# Patient Record
Sex: Male | Born: 1960 | Race: White | Hispanic: No | Marital: Married | Smoking: Never smoker
Health system: Southern US, Community
[De-identification: ages and names within clinical notes are randomized; demographics above are authoritative.]

## PROBLEM LIST (undated history)

## (undated) DIAGNOSIS — K589 Irritable bowel syndrome without diarrhea: Secondary | ICD-10-CM

## (undated) DIAGNOSIS — A0472 Enterocolitis due to Clostridium difficile, not specified as recurrent: Secondary | ICD-10-CM

## (undated) DIAGNOSIS — I10 Essential (primary) hypertension: Secondary | ICD-10-CM

## (undated) DIAGNOSIS — E785 Hyperlipidemia, unspecified: Secondary | ICD-10-CM

## (undated) DIAGNOSIS — K648 Other hemorrhoids: Secondary | ICD-10-CM

## (undated) DIAGNOSIS — F419 Anxiety disorder, unspecified: Secondary | ICD-10-CM

## (undated) DIAGNOSIS — M75100 Unspecified rotator cuff tear or rupture of unspecified shoulder, not specified as traumatic: Secondary | ICD-10-CM

## (undated) HISTORY — DX: Other hemorrhoids: K64.8

## (undated) HISTORY — DX: Anxiety disorder, unspecified: F41.9

## (undated) HISTORY — DX: Hyperlipidemia, unspecified: E78.5

## (undated) HISTORY — DX: Irritable bowel syndrome, unspecified: K58.9

## (undated) HISTORY — DX: Unspecified rotator cuff tear or rupture of unspecified shoulder, not specified as traumatic: M75.100

## (undated) HISTORY — DX: Enterocolitis due to Clostridium difficile, not specified as recurrent: A04.72

## (undated) HISTORY — DX: Essential (primary) hypertension: I10

## (undated) HISTORY — PX: TONSILLECTOMY: SUR1361

## (undated) HISTORY — PX: KNEE SURGERY: SHX244

---

## 2000-01-07 HISTORY — PX: TUMOR REMOVAL: SHX12

## 2000-03-03 ENCOUNTER — Encounter: Payer: Self-pay | Admitting: Family Medicine

## 2000-03-03 ENCOUNTER — Ambulatory Visit (HOSPITAL_COMMUNITY): Admission: RE | Admit: 2000-03-03 | Discharge: 2000-03-03 | Payer: Self-pay | Admitting: Family Medicine

## 2000-04-16 ENCOUNTER — Encounter (INDEPENDENT_AMBULATORY_CARE_PROVIDER_SITE_OTHER): Payer: Self-pay | Admitting: *Deleted

## 2000-04-16 ENCOUNTER — Ambulatory Visit (HOSPITAL_COMMUNITY): Admission: RE | Admit: 2000-04-16 | Discharge: 2000-04-17 | Payer: Self-pay | Admitting: Otolaryngology

## 2002-02-15 ENCOUNTER — Encounter: Payer: Self-pay | Admitting: Emergency Medicine

## 2002-02-15 ENCOUNTER — Emergency Department (HOSPITAL_COMMUNITY): Admission: EM | Admit: 2002-02-15 | Discharge: 2002-02-15 | Payer: Self-pay | Admitting: Emergency Medicine

## 2005-01-10 ENCOUNTER — Ambulatory Visit: Payer: Self-pay | Admitting: Internal Medicine

## 2005-01-27 ENCOUNTER — Ambulatory Visit: Payer: Self-pay | Admitting: Internal Medicine

## 2006-01-02 DIAGNOSIS — K648 Other hemorrhoids: Secondary | ICD-10-CM

## 2006-01-02 HISTORY — DX: Other hemorrhoids: K64.8

## 2006-02-23 ENCOUNTER — Ambulatory Visit: Payer: Self-pay | Admitting: Internal Medicine

## 2006-03-10 ENCOUNTER — Ambulatory Visit: Payer: Self-pay | Admitting: Internal Medicine

## 2006-03-10 ENCOUNTER — Encounter (INDEPENDENT_AMBULATORY_CARE_PROVIDER_SITE_OTHER): Payer: Self-pay | Admitting: *Deleted

## 2006-03-10 ENCOUNTER — Encounter (INDEPENDENT_AMBULATORY_CARE_PROVIDER_SITE_OTHER): Payer: Self-pay | Admitting: Specialist

## 2007-08-09 ENCOUNTER — Telehealth: Payer: Self-pay | Admitting: Internal Medicine

## 2007-11-29 ENCOUNTER — Encounter: Admission: RE | Admit: 2007-11-29 | Discharge: 2007-11-29 | Payer: Self-pay | Admitting: Internal Medicine

## 2008-02-04 ENCOUNTER — Telehealth: Payer: Self-pay | Admitting: Internal Medicine

## 2008-02-24 DIAGNOSIS — E785 Hyperlipidemia, unspecified: Secondary | ICD-10-CM

## 2008-02-24 DIAGNOSIS — E119 Type 2 diabetes mellitus without complications: Secondary | ICD-10-CM

## 2008-02-24 DIAGNOSIS — I10 Essential (primary) hypertension: Secondary | ICD-10-CM | POA: Insufficient documentation

## 2008-02-28 ENCOUNTER — Ambulatory Visit: Payer: Self-pay | Admitting: Internal Medicine

## 2008-02-28 DIAGNOSIS — R1084 Generalized abdominal pain: Secondary | ICD-10-CM | POA: Insufficient documentation

## 2008-03-06 ENCOUNTER — Ambulatory Visit (HOSPITAL_COMMUNITY): Admission: RE | Admit: 2008-03-06 | Discharge: 2008-03-06 | Payer: Self-pay | Admitting: Internal Medicine

## 2008-03-07 ENCOUNTER — Ambulatory Visit: Payer: Self-pay | Admitting: Internal Medicine

## 2008-03-07 DIAGNOSIS — R1031 Right lower quadrant pain: Secondary | ICD-10-CM

## 2008-03-08 ENCOUNTER — Ambulatory Visit: Payer: Self-pay | Admitting: Internal Medicine

## 2008-03-09 ENCOUNTER — Telehealth (INDEPENDENT_AMBULATORY_CARE_PROVIDER_SITE_OTHER): Payer: Self-pay | Admitting: *Deleted

## 2009-09-24 ENCOUNTER — Emergency Department (HOSPITAL_COMMUNITY): Admission: EM | Admit: 2009-09-24 | Discharge: 2009-09-24 | Payer: Self-pay | Admitting: Emergency Medicine

## 2009-10-06 HISTORY — PX: NM MYOCAR PERF WALL MOTION: HXRAD629

## 2010-02-03 LAB — CONVERTED CEMR LAB
BUN: 12 mg/dL (ref 6–23)
Creatinine, Ser: 0.9 mg/dL (ref 0.4–1.5)

## 2010-03-21 LAB — POCT I-STAT, CHEM 8
Calcium, Ion: 1.1 mmol/L — ABNORMAL LOW (ref 1.12–1.32)
Creatinine, Ser: 0.7 mg/dL (ref 0.4–1.5)
Glucose, Bld: 112 mg/dL — ABNORMAL HIGH (ref 70–99)
HCT: 47 % (ref 39.0–52.0)
Hemoglobin: 16 g/dL (ref 13.0–17.0)
Potassium: 4.4 mEq/L (ref 3.5–5.1)

## 2010-03-21 LAB — CBC
Hemoglobin: 15.4 g/dL (ref 13.0–17.0)
MCH: 30.9 pg (ref 26.0–34.0)
MCHC: 34.9 g/dL (ref 30.0–36.0)
RDW: 14.5 % (ref 11.5–15.5)

## 2010-03-21 LAB — DIFFERENTIAL
Basophils Absolute: 0 10*3/uL (ref 0.0–0.1)
Basophils Relative: 1 % (ref 0–1)
Eosinophils Absolute: 0.1 10*3/uL (ref 0.0–0.7)
Monocytes Absolute: 0.8 10*3/uL (ref 0.1–1.0)
Monocytes Relative: 11 % (ref 3–12)
Neutro Abs: 4.2 10*3/uL (ref 1.7–7.7)
Neutrophils Relative %: 64 % (ref 43–77)

## 2010-03-21 LAB — POCT CARDIAC MARKERS
CKMB, poc: <1 ng/mL — ABNORMAL LOW (ref 1.0–8.0)
Troponin i, poc: <0.05 ng/mL (ref 0.00–0.09)

## 2010-05-07 ENCOUNTER — Telehealth: Payer: Self-pay | Admitting: Internal Medicine

## 2010-05-07 NOTE — Telephone Encounter (Signed)
Pt states he thinks he is having a colitis flare. Pt has been having diarrhea, abdominal discomfort below his belly button for about 10 days. Pt would like to be seen. Appt made for pt to see Amy Esterwood PA 05/08/10@9 :30am.

## 2010-05-08 ENCOUNTER — Encounter: Payer: Self-pay | Admitting: Physician Assistant

## 2010-05-08 ENCOUNTER — Ambulatory Visit (INDEPENDENT_AMBULATORY_CARE_PROVIDER_SITE_OTHER): Payer: 59 | Admitting: Physician Assistant

## 2010-05-08 VITALS — BP 142/88 | HR 52 | Ht 70.0 in | Wt 204.2 lb

## 2010-05-08 DIAGNOSIS — R197 Diarrhea, unspecified: Secondary | ICD-10-CM

## 2010-05-08 DIAGNOSIS — K589 Irritable bowel syndrome without diarrhea: Secondary | ICD-10-CM

## 2010-05-08 DIAGNOSIS — R1084 Generalized abdominal pain: Secondary | ICD-10-CM

## 2010-05-08 MED ORDER — ALIGN 4 MG PO CAPS: 4.0000 mg | ORAL_CAPSULE | Freq: Every day | ORAL | Status: DC

## 2010-05-08 MED ORDER — HYOSCYAMINE SULFATE ER 0.375 MG PO TB12: 375.0000 ug | ORAL_TABLET | Freq: Two times a day (BID) | ORAL | Status: DC | PRN

## 2010-05-08 NOTE — Progress Notes (Signed)
  Subjective:    Patient ID: Bradley [name], male    DOB: 1960-01-14, 50 y.o.   MRN: [id number]  HPI Bradley [name] is a pleasant 50 year old white male known to Dr. Marina Goodell who has been evaluated in the past for abdominal pain and felt likely to have IBS. He underwent colonoscopy in 2008 for abdominal pain and change in his bowel habits and this was a normal exam. Random biopsies were also done and these were normal. He has had prior abdominal ultrasound which was unremarkable as well. He does have cough diabetes controlled with oral agents. He does have a history of C. difficile associated diarrhea in 2007.  He comes in today with recurrence of abdominal pain over the past 10 days. He says he is having frequent bowel movements with 5-6 bowel movements per day associated with urgency and loose stools but no overt diarrhea. He has not noted any melena or hematochezia. He complains of 80 generalized abdominal discomfort with a fullness and crampy sensation in his abdomen. His appetite has been fine he has not had any nausea or vomiting, no fever or chills. He generally does not feel any different after meals. He is concerned because of the persistence of his symptoms now over one week. He says he does take fairly frequent antibiotics for sinusitis and is sure that he has had antibiotics earlier this year. He also travels a lot  Within the [place] He had called Dr. Marja Kays office with these current symptoms and was given an empiric course of Bactrim which she has taken for the past 2 days. He has not had stool cultures or labs done.   Review of Systems  Constitutional: Negative.   HENT: Negative.   Eyes: Negative.   Respiratory: Negative.   Cardiovascular: Negative.   Gastrointestinal: Positive for abdominal pain, diarrhea and abdominal distention.  Genitourinary: Negative.   Musculoskeletal: Negative.   Skin: Negative.   Neurological: Negative.   Hematological: Negative.   Psychiatric/Behavioral:  Negative.        Objective:   Physical Exam Well-developed white male in no acute distress, pleasant, alert and oriented x3 HEENT nonicteric normocephalic EOMI PERRLA sclera anicteric  Neck; supple no JVD  Cardiovascular; regular rate and rhythm with S1 and S2 no murmur rub or gallop  Pulmonary; clear bilaterally  Abdomen; soft no appreciable distention bowel sounds are active, he is mildly tender across his mid abdomen and in the left mid quadrant left lower quadrant, there is no guarding or rebound no palpable mass or hepatosplenomegaly  Rectal; not done  Skin; warm and dry benign  Psych; mood and affect normal an appropriate        Assessment & Plan:  #52 50 year old white male with remote history of C. difficile colitis, and history of IBS with 10 day history of generalized mid abdominal pain bloating cramping and frequent stools. Rule out exacerbation of IBS, rule out infectious gastroenteritis, rule out recurrent C. difficile.  Plan; check stool cultures including C. difficile apcr, O&P, wbc's and routine culture. Will continue current course of Bactrim for now Start trial of Align one by mouth daily x30 days Cap refill Levbid one by mouth twice a day as needed for cramping Further plans pending results of stool cultures and his clinical course, he is asked to call in about 10 days if he is not significantly improved.  #2 Adult onset diabetes mellitus  #3 Hypertension  #4 Hyperlipidemia

## 2010-05-08 NOTE — Progress Notes (Signed)
I agree with this plan.

## 2010-05-08 NOTE — Patient Instructions (Signed)
Please go to the basement level to have your labs drawn.  We have given you samples of Align to take for 4 weeks, take 1 capsule daily. Continue the Bactrim you are taking. We sent a prescription for Levbid ( Hyoscyamine) to take 1 every 12 hours as needed for cramping and spasms. We sent it to [facility].  Call us in 7-10 days or sooner if you are not doing better.

## 2010-05-09 ENCOUNTER — Other Ambulatory Visit: Payer: 59

## 2010-05-09 DIAGNOSIS — K589 Irritable bowel syndrome without diarrhea: Secondary | ICD-10-CM

## 2010-05-09 DIAGNOSIS — R1084 Generalized abdominal pain: Secondary | ICD-10-CM

## 2010-05-09 DIAGNOSIS — R197 Diarrhea, unspecified: Secondary | ICD-10-CM

## 2010-05-10 LAB — CLOSTRIDIUM DIFFICILE BY PCR

## 2010-05-10 LAB — FECAL LACTOFERRIN, QUANT: Lactoferrin: NEGATIVE

## 2010-05-13 LAB — STOOL CULTURE

## 2010-05-21 ENCOUNTER — Telehealth: Payer: Self-pay | Admitting: Internal Medicine

## 2010-05-21 NOTE — Telephone Encounter (Signed)
Pt is calling and asking for his lab results from his visit on May 3rd. Please advise.

## 2010-05-22 NOTE — Telephone Encounter (Signed)
SORRY, I THOUGHT WE CALLED THEM TOO HIM- ALL OF THE STOOL STUDIES  WERE NEGATIVE. HOW IS HE FEELING?

## 2010-05-22 NOTE — Telephone Encounter (Signed)
Results given to pt per Mike Gip PA. Pt states he is feeling about the same and is following up with his primary care md.

## 2010-05-24 NOTE — Op Note (Signed)
[place]. [facility]  Patient:    Bradley [name], Bradley [name]                   MRN: [id number] Proc. Date: 04/16/00 Attending:  Keturah Barre, M.D. CC:         Doris Cheadle. Lyman Bishop, M.D.  Marinda Elk, M.D.   Operative Report  PREOPERATIVE DIAGNOSIS:  Right second branchial cleft cyst.  POSTOPERATIVE DIAGNOSIS:  Right second branchial cleft cyst.  OPERATION PERFORMED:  Resection of right second branchial cleft cyst.  SURGEON:  Keturah Barre, M.D.  ASSISTANT:  Doris Cheadle. Lyman Bishop, M.D.  ANESTHESIA:  General endotracheal.  ANESTHESIOLOGIST:  Dr. Ivin Booty.  DESCRIPTION OF PROCEDURE:  The patient was placed in supine position.  Under general endotracheal anesthesia, the patient was positioned, shoulder roll and the head turned to the left.  The right neck exposed and the neck extended. There was an obvious raised full area of 3 x 3 cm mass beneath the sternocleidomastoid.  He was prepped and draped in the appropriate manner using Betadine x 3 from the clavicles to the eyes and once appropriately draped, we used a marking pen to mark landmarks and then using an incisional crease in the midneck, an incision was made, carried down through the platysma.  The sternocleidomastoid was developed where we developed superiorly and inferiorly, the flaps of skin and then the sternocleidomastoid was mobilized.  Beneath this was the large cyst.  The cyst was quite well encapsulated.  Was not subject to a huge amount of inflammation.  Some inflammation was noted, making it rather tight and rather difficult to elevate but we were able to elevate most of this cyst before we removed the fluid from the cyst.  Once the fluid was removed, we did a culture, suctioned out the entire cyst, cultured anaerobic and aerobic.  The patient having been on Biaxin primarily and cephalosporins and is on IV cephalosporin at this time. The neck was carefully irrigated and then dissection was  carried on where the medial one quarter of the dissection was finished.  We carefully dissected this cyst off the carotid vessels and carefully guarded the vagus and the jugular vein and once this was separated, the cyst was quite easy to remove. It did not extend medial to the carotid system.  Once the cyst was removed, then we carefully Bovie electrocoagulated any small bleeder.  We Bovie electrocoagulated bleeders as we removed the cyst so it was really quite dry and the carotid jugular system and the vagus region was in excellent condition.  All hemostasis was established on the back side of the sternocleidomastoid and the skin flaps were checked again and found to be clear of any bleeding.  Irrigation was then carried out once again and then a #10 Jackson-Pratt drain was placed through a separate small incision in the posterior triangle and extended into this area.  A small amount of Surgicel was placed over the carotid region also as an extra safety precaution.  Once all hemostasis was established, the closure was begun by replacing our dissection freeing up the sternocleidomastoid and then the subcutaneous closure was with 3-0 plain catgut.  Ties were used during the surgery using 2-0 silk and then the skin was closed with 5-0 Ethilon.  No drainage of note was noted in the Jackson-Pratt bulb as we were closing.  The patient was doing very well postoperatively and will stay overnight for observation.  His follow-up will be then after observation in  one week, three weeks, six weeks, and six months. DD:  04/16/00 TD:  04/16/00 Job: [id number]

## 2010-05-24 NOTE — Assessment & Plan Note (Signed)
[facility]                         GASTROENTEROLOGY OFFICE NOTE   NAME:Bradley [name], Bradley [name]                    MRN:          [id number]  DATE:02/23/2006                            DOB:          05/25/1960    HISTORY OF PRESENT ILLNESS:  Bradley [name] presents today with new complaints  of abdominal discomfort and change of bowel habits. He is a 50 year old  with a history of Clostridium difficile colitis, hypertension,  hyperlipidemia, and diabetes. He was last evaluated January 27, 2005  when he had recovered from antibiotic associated diarrhea. He presents  now with a several month history of intermittent problems with  generalized cramping abdominal discomfort generally made worse with  meals and occasionally associated with urgency and loose stools.  Symptoms do seem to be improved with defecation. He has also noticed  some blood in mucus intermittently. He has had insignificant weight  loss. No nausea, vomiting, upper abdominal pain, heartburn, dysphagia or  melena. He has been taking Pepto-Bismol with slightly improved form of  his stools. He denies fevers, joint aches, rash or recent exposure to  antibiotics.   CURRENT MEDICATIONS:  1. Lipitor 10 mg daily.  2. Diovan 12.5 mg daily.  3. Lorazepam 0.5 mg t.i.d.  4. Nadolol 5 mg daily.  5. Actos unspecified dosage daily.   ALLERGIES:  He has no known drug allergies.   PHYSICAL EXAMINATION:  GENERAL:  A well-appearing male in no acute  distress.  VITAL SIGNS:  Blood pressure 142/78, heart rate 60 and regular, weight  188.6 pounds.  HEENT:  Sclerae anicteric, conjunctivae are pink. Oral mucosa intact. No  aphthous ulcerations. NECK:  There is no adenopathy.  LUNGS:  Clear.  HEART:  Regular.  ABDOMEN:  Soft without tenderness, masses or hernia. Good bowel sounds  heard.  RECTAL:  Deferred.  EXTREMITIES:  Without edema.   IMPRESSION:  Several month history of problems with abdominal cramping,  urgency, loose stools. Also some blood in mucus. No nocturnal symptoms.  Suspect irritable bowel though cannot rule out idiopathic colitis.   RECOMMENDATIONS:  1. Colonoscopy with biopsies. The nature of the procedure as well as      the risks, benefits, and alternatives have been reviewed. He      understands and agrees to proceed.  2. If colonoscopy and biopsies are negative for macroscopic or      microscopic colitis, then treatment for probable irritable bowel      including probiotics and antispasmodics.     Wilhemina Bonito. Marina Goodell, MD  Electronically Signed    JNP/MedQ  DD: 02/23/2006  DT: 02/23/2006  Job #: [id number]   cc:   Barry Dienes. Eloise Harman, M.D.

## 2010-05-24 NOTE — H&P (Signed)
[place]. [facility]  Patient:    Bradley [name], Bradley [name]                      MRN: [id number] Adm. Date:  04/16/00 Attending:  Keturah Barre, M.D. CC:         Marinda Elk, M.D., [facility]   History and Physical  CHIEF COMPLAINT: This patient is a 50 year old male who has had what started with a swelling in his right neck, which increased in size and which became painful.  HISTORY OF PRESENT ILLNESS: He was placed on antibiotics and a CAT scan was obtained, and it showed a 3 x 3.8 cm cystic lesion which appeared to be a branchial cleft cyst.  He has been on Keflex and Biaxin and Ceftin in an effort to bring this back under control, and the inflammation has been brought under control and the pain is much better but the size of the cyst has pretty much stayed the same.  A CAT scan shows that it is right up against and pushing the carotid branches medial, and it is essentially directly under the sternocleidomastoid muscle.  After a fairly long term here, approximately six to seven weeks of medical care, the patient now enters for resection of this right second branchial cleft cyst.  CURRENT MEDICATIONS:  1. Zestoretic 20/12.5 mg q.a.m.  2. Nadolol 20 mg q.a.m.  3. Lipitor 10 mg q.h.s.  SOCIAL HISTORY: He does not smoke.  PAST SURGICAL HISTORY: He had a T&A as a child.  PAST MEDICAL HISTORY: He has been on medication for four years for hypertension and his blood pressure is 120/70 at this point.  The remainder of his general medical health is excellent.  No respiratory problems.  No neurologic, endocrine, GI, GU, or other ENT problems.  PHYSICAL EXAMINATION:  VITAL SIGNS: His blood pressure is 120/70, pulse is 60, respiratory rate is 12.  He is 70 inches tall and weighs 200 pounds.  GENERAL: He is a well-developed, well-nourished white male in no acute distress.  HEENT: Ears are clear.  The tympanic membranes look excellent.  The  larynx is clear.  NECK: Obvious right raised firm cystic mass noted beneath the sternocleidomastoid muscle.  The vocal cords, true cords, false cords, epiglottic, and base of tongue are all clear of any ulceration or mass.  The tonsillar region is all clear of ulceration or mass.  Floor of mouth and salivary glands within normal limits.  CHEST: Clear.  No rales, rhonchi, or wheezes.  CARDIOVASCULAR: No murmurs, rubs, or gallops.  ABDOMEN: Unremarkable.  EXTREMITIES: Unremarkable.  NEUROLOGIC: Cranial nerves 2-12 within normal limits and symmetrical. Oriented x 3.  Cranial nerves intact.  DIAGNOSES:  1. Right second branchial cleft cyst.  2. History of blood pressure elevation.  3. History of tonsillectomy and adenoidectomy as a child. DD:  04/16/00 TD:  04/16/00 Job: [id number]

## 2010-10-25 ENCOUNTER — Emergency Department (HOSPITAL_COMMUNITY): Payer: 59

## 2010-10-25 ENCOUNTER — Emergency Department (HOSPITAL_COMMUNITY)
Admission: EM | Admit: 2010-10-25 | Discharge: 2010-10-25 | Disposition: A | Discharge status: Home / Self Care | Payer: 59 | Attending: Emergency Medicine | Admitting: Emergency Medicine

## 2010-10-25 DIAGNOSIS — E7800 Pure hypercholesterolemia, unspecified: Secondary | ICD-10-CM | POA: Insufficient documentation

## 2010-10-25 DIAGNOSIS — I10 Essential (primary) hypertension: Secondary | ICD-10-CM | POA: Insufficient documentation

## 2010-10-25 DIAGNOSIS — E119 Type 2 diabetes mellitus without complications: Secondary | ICD-10-CM | POA: Insufficient documentation

## 2010-10-25 DIAGNOSIS — Z79899 Other long term (current) drug therapy: Secondary | ICD-10-CM | POA: Insufficient documentation

## 2010-10-25 DIAGNOSIS — G8918 Other acute postprocedural pain: Secondary | ICD-10-CM | POA: Insufficient documentation

## 2010-10-25 LAB — BASIC METABOLIC PANEL
BUN: 13 mg/dL (ref 6–23)
CO2: 27 mEq/L (ref 19–32)
Calcium: 11.1 mg/dL — ABNORMAL HIGH (ref 8.4–10.5)
Creatinine, Ser: 0.88 mg/dL (ref 0.50–1.35)
Glucose, Bld: 174 mg/dL — ABNORMAL HIGH (ref 70–99)

## 2010-10-25 LAB — CBC
HCT: 45.9 % (ref 39.0–52.0)
Hemoglobin: 16.3 g/dL (ref 13.0–17.0)
MCH: 30.6 pg (ref 26.0–34.0)
MCHC: 35.5 g/dL (ref 30.0–36.0)
MCV: 86.1 fL (ref 78.0–100.0)
RBC: 5.33 MIL/uL (ref 4.22–5.81)

## 2010-10-25 LAB — DIFFERENTIAL
Lymphs Abs: 1.1 10*3/uL (ref 0.7–4.0)
Monocytes Absolute: 1.8 10*3/uL — ABNORMAL HIGH (ref 0.1–1.0)
Monocytes Relative: 10 % (ref 3–12)
Neutro Abs: 14.8 10*3/uL — ABNORMAL HIGH (ref 1.7–7.7)
Neutrophils Relative %: 84 % — ABNORMAL HIGH (ref 43–77)

## 2011-10-28 ENCOUNTER — Telehealth: Payer: Self-pay | Admitting: Internal Medicine

## 2011-10-28 NOTE — Telephone Encounter (Signed)
Pt states he has had diarrhea and abdominal pain that radiates around to his back, the pain is around the waist line. His PCP gave him levbid and it helped a little bit but is not helping now. Requesting to be seen. Pt scheduled to see Willette Cluster NP Thursday at 8:30am. Pt aware of appt date and time.

## 2011-10-30 ENCOUNTER — Other Ambulatory Visit: Payer: 59

## 2011-10-30 ENCOUNTER — Encounter: Payer: Self-pay | Admitting: *Deleted

## 2011-10-30 ENCOUNTER — Ambulatory Visit (INDEPENDENT_AMBULATORY_CARE_PROVIDER_SITE_OTHER): Payer: 59 | Admitting: Nurse Practitioner

## 2011-10-30 VITALS — BP 140/78 | HR 58 | Ht 69.0 in | Wt 197.0 lb

## 2011-10-30 DIAGNOSIS — R197 Diarrhea, unspecified: Secondary | ICD-10-CM | POA: Insufficient documentation

## 2011-10-30 MED ORDER — METRONIDAZOLE 250 MG PO TABS: 250.0000 mg | ORAL_TABLET | Freq: Three times a day (TID) | ORAL | Status: AC

## 2011-10-30 MED ORDER — ALIGN PO CAPS: 1.0000 | ORAL_CAPSULE | Freq: Every day | ORAL | Status: DC

## 2011-10-30 NOTE — Patient Instructions (Addendum)
Please go to the basement level to  the lab for stool studies.  Take the Hyoscyamine (Levbid) as needed for cramps. Bradley [name] ACNP will have a nurse call you with stool study results.  We will call your pharmacy with the Flagyl prescription and keep that on hold .  If you think you need it they can fill it.  We have given you samples of Align. This puts good bacteria back into your colon. You should take 1 capsule by mouth once daily. If this works well for you, it can be purchased over the counter.

## 2011-10-30 NOTE — Progress Notes (Signed)
10/30/2011 Bradley [name] [id number] Jun 03, 1960   History of Present Illness:  Patient is a 51 year old male with a history of diabetes, hypertensionknown to Dr.Perry for a history of irritable bowel type symptoms as well as a remote history of C. difficile colitis in 2007. Patient was last seen May 2012 for evaluation of abdominal pain and loose stool.  At that time his stool studies were negative. Patient was given a trial of probiotics and hyoscyamine.  His symptoms resolved and he did well until late September after returning from a trip to [place]. 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 Upon arriving home from [place] the patient developed crampy diarrhea. A few days later he saw PCP. I reviewed records, his CBC was normal. Patient was advised to try probiotics for 21 days. A week or so ago patient called his PCP and was given Hyoscyamine for cramps. He felt better for a week but now with recurrent lower abdominal discomfort. Patient is not having diarrhea  . but his he is having more frequent BMs than normal(averging about 4 bowel movements a day).  No recent antibiotics  No fevers. No blood in stool. He is taking Hyoscyamine once daily.  Current Medications, Allergies, Past Medical History, Past Surgical History, Family History and Social History were reviewed in [employer] record.   Physical Exam: General: Well developed , white male in no acute distress Head: Normocephalic and atraumatic Eyes:  sclerae anicteric, conjunctiva pink  Ears: Normal auditory acuity Lungs: Clear throughout to auscultation Heart: Regular rate and rhythm Abdomen: Soft, non distended, mild BLQ tenderness. No masses, no hepatomegaly. Normal bowel sounds Musculoskeletal: Symmetrical with no gross deformities  Extremities: No edema  Neurological: Alert oriented x 4, grossly nonfocal Psychological:  Alert and cooperative. Normal mood and  affect  Assessment and Recommendations:  51 year old male with recent history of crampy diarrhea.  Diarrhea resolved with probiotics and hyoscyamine but still having increased frequency of formed stools and he is still having lower abdominal discomfort.  Patient does have a history of C. difficile colitis in 2007. Doubt recurrent C. difficile colitis but it does need to be excluded so stool studies for C. Diff, as well as stool culture will be obtained.  CBC at PCPs office was normal, abdominal exam is not overly concerning today. I will give patient a prescription for Flagyl to begin over the weekend should his symptoms worsen. Patient will be called with stool study results. He may increase Hyoscyamine to 2-3 times daily as needed.

## 2011-10-31 ENCOUNTER — Encounter: Payer: Self-pay | Admitting: Nurse Practitioner

## 2011-10-31 NOTE — Progress Notes (Signed)
Agree with initial assessment and plans. Gunnar Fusi will followup on stool studies. Hopefully antispasmodic will help

## 2011-11-03 ENCOUNTER — Telehealth: Payer: Self-pay | Admitting: Internal Medicine

## 2011-11-03 LAB — STOOL CULTURE

## 2011-11-03 MED ORDER — CILIDINIUM-CHLORDIAZEPOXIDE 2.5-5 MG PO CAPS: ORAL_CAPSULE | ORAL | Status: DC

## 2011-11-03 NOTE — Telephone Encounter (Signed)
Pt did not take the Flagyl. Instructed pt to pick up the Flagyl and start taking the medication. Librax prescription sent to pharmacy. Pt to call back and scheduled F/U OV.

## 2011-11-03 NOTE — Telephone Encounter (Signed)
Pt last seen by Willette Cluster NP 10/30/11. Pts stool studies were negative for cdiff. Pt is taking align and levbid. States the levbid is not really helping with his cramping. His diarrhea has resolved but he is still having about 3-4 stools/day.States he is still having dull pain around his waist that goes all the way down through his groin. Pt wants to know what he can do about the pain around his waist line area. Dr. Marina Goodell please advise.

## 2011-11-03 NOTE — Telephone Encounter (Signed)
Paula prescribed metronidazole at the time of his last visit. Did he fill the prescription and complete it? If not, and have him do such. Also, prescribed Librax #100. One by mouth every 6 hours when necessary cramping pain. No refills. Have him followup with me in 6 weeks.

## 2011-11-10 ENCOUNTER — Other Ambulatory Visit: Payer: 59

## 2011-11-10 ENCOUNTER — Encounter: Payer: Self-pay | Admitting: Physician Assistant

## 2011-11-10 ENCOUNTER — Telehealth: Payer: Self-pay | Admitting: Internal Medicine

## 2011-11-10 ENCOUNTER — Ambulatory Visit (INDEPENDENT_AMBULATORY_CARE_PROVIDER_SITE_OTHER)
Admission: RE | Admit: 2011-11-10 | Discharge: 2011-11-10 | Disposition: A | Discharge status: Home / Self Care | Payer: 59 | Source: Ambulatory Visit | Attending: Physician Assistant | Admitting: Physician Assistant

## 2011-11-10 ENCOUNTER — Ambulatory Visit (INDEPENDENT_AMBULATORY_CARE_PROVIDER_SITE_OTHER): Payer: 59 | Admitting: Physician Assistant

## 2011-11-10 ENCOUNTER — Other Ambulatory Visit (INDEPENDENT_AMBULATORY_CARE_PROVIDER_SITE_OTHER): Payer: 59

## 2011-11-10 VITALS — BP 148/84 | HR 64 | Ht 70.0 in | Wt 193.8 lb

## 2011-11-10 DIAGNOSIS — R109 Unspecified abdominal pain: Secondary | ICD-10-CM

## 2011-11-10 LAB — CBC WITH DIFFERENTIAL/PLATELET
Basophils Relative: 0.5 % (ref 0.0–3.0)
Eosinophils Relative: 0.7 % (ref 0.0–5.0)
HCT: 48.3 % (ref 39.0–52.0)
Hemoglobin: 15.9 g/dL (ref 13.0–17.0)
MCV: 87.8 fl (ref 78.0–100.0)
Monocytes Absolute: 0.7 10*3/uL (ref 0.1–1.0)
Neutro Abs: 4.8 10*3/uL (ref 1.4–7.7)
Neutrophils Relative %: 66.8 % (ref 43.0–77.0)
RBC: 5.51 Mil/uL (ref 4.22–5.81)
WBC: 7.1 10*3/uL (ref 4.5–10.5)

## 2011-11-10 LAB — BASIC METABOLIC PANEL
BUN: 13 mg/dL (ref 6–23)
GFR: 86.45 mL/min (ref >60.00)
Glucose, Bld: 121 mg/dL — ABNORMAL HIGH (ref 70–99)
Potassium: 4.9 mEq/L (ref 3.5–5.1)

## 2011-11-10 MED ORDER — IOHEXOL 300 MG/ML  SOLN
100.0000 mL | Freq: Once | INTRAMUSCULAR | Status: AC | PRN
  Administered 2011-11-10: 100 mL via INTRAVENOUS

## 2011-11-10 NOTE — Telephone Encounter (Signed)
Pt states he is not better, he is still having abdominal pain. States the pain is mainly on his left side and it radiates down in the groin area. States that this pain comes and goes and at times it is a 6 on a scale of 1-10. Pt has levbid and took flagyl but states the pain is not better. Pt scheduled to see Mike Gip PA today at 11am. Pt aware of appt date and time.

## 2011-11-10 NOTE — Progress Notes (Signed)
Subjective:    Patient ID: Bradley [name], male    DOB: [birth year], 51 y.o.   MRN: [id number]  HPI Bradley [name] is a pleasant 51 year old white male known to Dr. Marina Goodell , with history of adult onset diabetes mellitus, hypertension, and hyperlipidemia. He has had intermittent problems with lower Donald discomfort and increased frequency of stools and carries a diagnosis of IBS. He had colonoscopy done in 2008 which was negative, including random biopsies. He did have an episode of C. difficile colitis in 2007. He was seen here by Willette Cluster in October of 2013, at that time complaining of diarrhea and abdominal discomfort which had started after a business trip. He had stool studies done, all of which were negative, his hyoscyamine dose was increased, and he was given an empiric short course of Flagyl. He says initially that he thought the Flagyl was making him feel better but now over the past 6 days he said increased lower abdominal discomfort particularly on the left side which is dull low level, but fairly constant  during the day. He says he has not been by been bothered by abdominal discomfort at night. His bowel movements have been fairly normal for him, somewhat on the loose side- no blood noted. He has not had any fever chills or sweats. He says his appetite has been decreased, he has not been particularly hungry, but has not lost any weight.    Review of Systems  Constitutional: Negative.   HENT: Negative.   Eyes: Negative.   Respiratory: Negative.   Cardiovascular: Negative.   Gastrointestinal: Positive for abdominal pain and diarrhea.  Genitourinary: Negative.   Musculoskeletal: Negative.   Neurological: Negative.   Hematological: Negative.   Psychiatric/Behavioral: Negative.    Outpatient Prescriptions Prior to Visit  Medication Sig Dispense Refill  . atorvastatin (LIPITOR) 40 MG tablet Take 40 mg by mouth daily.        . bifidobacterium infantis (ALIGN) capsule Take 1 capsule by  mouth daily.  21 capsule  0  . clidinium-chlordiazePOXIDE (LIBRAX) 2.5-5 MG per capsule Take 1 by mouth every 6 hours as needed for abdominal cramping  100 capsule  0  . nadolol (CORGARD) 20 MG tablet Take by mouth. Take 1/2 tablet once daily       . pioglitazone (ACTOS) 15 MG tablet Take 15 mg by mouth daily.        . Probiotic Product (ALIGN) 4 MG CAPS Take 4 mg by mouth daily.  28 capsule  0  . valsartan-hydrochlorothiazide (DIOVAN HCT) 80-12.5 MG per tablet Take 1 tablet by mouth daily.        . hyoscyamine (LEVBID) 0.375 MG 12 hr tablet Take 1 tablet (375 mcg total) by mouth every 12 (twelve) hours as needed for cramping.  60 tablet  1  . metroNIDAZOLE (FLAGYL) 250 MG tablet Take 1 tablet (250 mg total) by mouth 3 (three) times daily.  30 tablet  0      No Known Allergies Patient Active Problem List  Diagnosis  . DM  . HYPERLIPIDEMIA  . HYPERTENSION  . ABDOMINAL PAIN-GENERALIZED  . IBS (irritable bowel syndrome)  . Diarrhea   History  Substance Use Topics  . Smoking status: Never Smoker   . Smokeless tobacco: Current User    Types: Chew  . Alcohol Use: Yes    Objective:   Physical Exam  well-developed white male in no acute distress, pleasant blood pressure 140/84 pulse 64 height 5 foot 10 weight 193. HEENT; nontraumatic normocephalic  EOMI PERRLA sclera anicteric,Neck; Supple no JVD, Cardiovascular; regular rate and rhythm with S1-S2 no murmur or gallop, Pulmonary; clear,, Abdomen; soft bowel sounds are active, no palpable mass or hepatosplenomegaly he is tender in the left mid quadrant and left lower quadrant no guarding or rebound, Rectal; exam not done, Extremities; no clubbing, cyanosis, or edema skin warm and dry, Psych; somewhat anxious but otherwise appropriate.        Assessment & Plan:  #77  51 year old male with history of IBS, last colonoscopy 2008 negative now presents with fairly constant dull left-sided lower abdominal  discomfort over the past 2 weeks, not  responding to a short course of Flagyl and hyoscyamine. Will rule out and transabdominal inflammatory process, diverticulitis.  #2 adult onset diabetes mellitus  #3 hypertension  Plan; schedule for CT scan of the abdomen and pelvis with contrast Check CBC with differential and be met today Further plans pending results of CT

## 2011-11-10 NOTE — Patient Instructions (Addendum)
You have been scheduled for a CT scan of the abdomen and pelvis at [facility] ([address]---this is in the same building as [facility]).   You are scheduled on 11-10-2011 at 3:30pm. You should arrive 15 minutes prior to your appointment time for registration. Please follow the written instructions below on the day of your exam:  WARNING: IF YOU ARE ALLERGIC TO IODINE/X-RAY DYE, PLEASE NOTIFY RADIOLOGY IMMEDIATELY AT [phone]! YOU WILL BE GIVEN A 13 HOUR PREMEDICATION PREP.  1) Do not eat or drink anything NOW except contrast 2) You have been given 2 bottles of oral contrast to drink. The solution may taste better if refrigerated, but do NOT add ice or any other liquid to this solution. Shake  well before drinking.    Drink 1 bottle of contrast @ 1:30 pm (2 hours prior to your exam)  Drink 1 bottle of contrast @ 2:30 pm (1 hour prior to your exam)  You may take any medications as prescribed with a small amount of water except for the following: Metformin, Glucophage, Glucovance, Avandamet, Riomet, Fortamet, Actoplus Met, Janumet, Glumetza or Metaglip. The above medications must be held the day of the exam AND 48 hours after the exam.  The purpose of you drinking the oral contrast is to aid in the visualization of your intestinal tract. The contrast solution may cause some diarrhea. Before your exam is started, you will be given a small amount of fluid to drink. Depending on your individual set of symptoms, you may also receive an intravenous injection of x-ray contrast/dye. Plan on being at [facility] for 30 minutes or long, depending on the type of exam you are having performed.  This test typically takes 30-45 minutes to complete.  If you have any questions regarding your exam or if you need to reschedule, you may call the CT department at [phone] between the hours of 8:00 am and 5:00 pm,  Monday-Friday.  ________________________________________________________________________    Please go to the basement for lab work before leaving today

## 2011-11-10 NOTE — Progress Notes (Signed)
Reviewed and agree with management plan. Shanterica Biehler T. Medea Deines MD FACG 

## 2011-11-11 ENCOUNTER — Telehealth: Payer: Self-pay | Admitting: Physician Assistant

## 2011-11-11 ENCOUNTER — Encounter: Payer: Self-pay | Admitting: Gastroenterology

## 2011-11-11 NOTE — Telephone Encounter (Signed)
ERROR

## 2011-11-19 NOTE — Telephone Encounter (Signed)
Nurse called pt with results.

## 2012-02-28 ENCOUNTER — Telehealth: Payer: Self-pay | Admitting: Gastroenterology

## 2012-02-28 MED ORDER — METRONIDAZOLE 500 MG PO TABS: 500.0000 mg | ORAL_TABLET | Freq: Three times a day (TID) | ORAL | Status: DC

## 2012-02-28 NOTE — Telephone Encounter (Signed)
On call phone note.  Bradley [name] has had acute, non bloody diarrhea for past 48 hours.  No fevers or chills.  abx for sinus infection in past 1-2 months by PCP.  Has had c diff in past.  I'm calling him in 10 day course of flagyl 500 tid for presumptive c. Diff.  He is already shceduled for rov with Dr. Marina Goodell in about 1-2 weeks, knows to call sooner if he worsnes before them.

## 2012-03-02 ENCOUNTER — Telehealth: Payer: Self-pay | Admitting: Internal Medicine

## 2012-03-02 NOTE — Telephone Encounter (Signed)
Pt states that he is feeling better now. Pt wanted to know if there is something he could take daily to help with IBS. Discussed with pt that he has align on his med list but he states he is not taking it now. Suggested pt take the align daily and to discuss this with Dr. Marina Goodell at his visit next week. Pt verbalized understanding.

## 2012-03-02 NOTE — Telephone Encounter (Signed)
Patient called in Saturday and spoke to doctor on call.  Dr. Christella Hartigan stated in note "On call phone note. Bradley [name] has had acute, non bloody diarrhea for past 48 hours. No fevers or chills. abx for sinus infection in past 1-2 months by PCP. Has had c diff in past. I'm calling him in 10 day course of flagyl 500 tid for presumptive c. Diff. He is already shceduled for rov with Dr. Marina Goodell in about 1-2 weeks, knows to call sooner if he worsnes before them."  Patient says he still has diarrhea and over all "not feeling well".  Has been on antibiotic since Saturday.

## 2012-03-08 ENCOUNTER — Encounter: Payer: Self-pay | Admitting: *Deleted

## 2012-03-08 ENCOUNTER — Telehealth: Payer: Self-pay | Admitting: Internal Medicine

## 2012-03-08 NOTE — Telephone Encounter (Signed)
Pt was given antibiotics for possible cdiff not diverticulitis flare. See phone note from Dr. Christella Hartigan.

## 2012-03-08 NOTE — Telephone Encounter (Signed)
Left message for pt to call back.  Pt was scheduled to see Dr. Marina Goodell for f/u of diverticulitis. Pt requesting to still be seen. Pt scheduled to see Chauncey Mann PA tomorrow at 10am. Pt aware of appt date and time.

## 2012-03-09 ENCOUNTER — Ambulatory Visit: Payer: 59 | Admitting: Internal Medicine

## 2012-03-09 ENCOUNTER — Encounter: Payer: Self-pay | Admitting: Gastroenterology

## 2012-03-09 ENCOUNTER — Ambulatory Visit (INDEPENDENT_AMBULATORY_CARE_PROVIDER_SITE_OTHER): Payer: 59 | Admitting: Gastroenterology

## 2012-03-09 VITALS — BP 124/70 | HR 56 | Ht 70.0 in | Wt 197.0 lb

## 2012-03-09 DIAGNOSIS — K589 Irritable bowel syndrome without diarrhea: Secondary | ICD-10-CM

## 2012-03-09 MED ORDER — HYOSCYAMINE SULFATE ER 0.375 MG PO TB12: 375.0000 ug | ORAL_TABLET | Freq: Two times a day (BID) | ORAL | Status: DC | PRN

## 2012-03-09 NOTE — Progress Notes (Signed)
Agree 

## 2012-03-09 NOTE — Progress Notes (Signed)
03/09/2012 Bradley [name] [id number] 01/15/1960   History of Present Illness:  Bradley [name] is a pleasant 52 year old white male known to Dr. Marina Goodell , with history of adult onset diabetes mellitus, hypertension, and hyperlipidemia. He has had intermittent problems with lower abdominal discomfort and increased frequency of stools and carries a diagnosis of IBS. He had colonoscopy done in 2008 which was negative, including random biopsies. He did have an episode of C. difficile colitis in 2007.  He was seen here by Willette Cluster in October of 2013, at that time complaining of diarrhea and abdominal discomfort which had started after a business trip. He had stool studies done, all of which were negative, his hyoscyamine dose was increased, and he was given an empiric short course of Flagyl. He says initially that he thought the Flagyl was making him feel better but then after 6 days he said increased lower abdominal discomfort particularly on the left side which was dull low level, but fairly constant  during the day.  He saw Amy Esterwood at that time and a CT scan of the abdomen and pelvis was ordered.  That study was normal.  He call on 2/22 with complaints of increased diarrhea.  Dr. Christella Hartigan suggested an empiric course of flagyl for possible Cdiff infection since he had this in the past.  He just finished the flagyl a couple of days ago and says that he has been feeling better.  Stools are loose with no blood.  No abdominal pain but just intermittent cramping.  Is here today for follow-up.  Current Medications, Allergies, Past Medical History, Past Surgical History, Family History and Social History were reviewed in [employer] record.   Physical Exam: BP 124/70  Pulse 56  Ht 5\' 10"  (1.778 m)  Wt 197 lb (89.359 kg)  BMI 28.27 kg/m2  SpO2 96% General: Well developed, white male in no acute distress Head: Normocephalic and atraumatic Eyes:  sclerae anicteric, conjunctiva pink   Ears: Normal auditory acuity Lungs: Clear throughout to auscultation Heart: Regular rate and rhythm Abdomen: Soft, non-tender and non-distended. No masses, no hepatomegaly. Normal bowel sounds. Musculoskeletal: Symmetrical with no gross deformities  Extremities: No edema  Neurological: Alert oriented x 4, grossly nonfocal Psychological:  Alert and cooperative. Normal mood and affect  Assessment and Recommendations: -IBS with diarrhea predominance.  Feeling fine currently.  S/P recent course of flagyl.  Normal colonoscopy 2008 and normal CT abdomen and pelvis 11/2011.  *He will begin re-taking align probiotics daily.  I also gave him another prescription for levbid to take prn.  Follow-up with Dr. Marina Goodell in 2-3 months.

## 2012-03-09 NOTE — Patient Instructions (Signed)
Please follow up with Dr. Marina Goodell in two or three months.  Please continue your Align. Take one capsule by mouth once daily.  You received a prescription today for Levbid. Please take as directed.

## 2012-03-18 ENCOUNTER — Telehealth: Payer: Self-pay | Admitting: Internal Medicine

## 2012-03-18 MED ORDER — DICYCLOMINE HCL 10 MG PO CAPS: ORAL_CAPSULE | ORAL | Status: DC

## 2012-03-18 NOTE — Telephone Encounter (Signed)
Bradley [name] pt with IBS, saw [name] PA 03/09/12. He was placed on Align daily and Levbid as needed. Pt is some better but still reports he is having about 4-5 diarrhea stools/day and he has some abdominal crampy pain. Pt is going out of town Saturday and wants to know if there is something else he can take. Would Bentyl perhaps be an option for the pt? Dr. Russella Dar as doc of the day please advise.

## 2012-03-18 NOTE — Telephone Encounter (Signed)
Pt states he is taking the Levbid BID and still having problems. Let him know we will send in a script for the Bentyl and he can try taking it as recommended.

## 2012-03-18 NOTE — Telephone Encounter (Signed)
He can try taking Levbid bid for the next several days and then can change to prn If he is currently using Levbid bid regularly and is still having problems try Bentyl 10 mg po qid for the next several days and then can change to prn

## 2012-03-22 ENCOUNTER — Telehealth: Payer: Self-pay | Admitting: Internal Medicine

## 2012-03-22 NOTE — Telephone Encounter (Signed)
Pt called and wanted to know if the IBS symptoms can come and go, explained to pt that yes they can. States he had no discomfort at all Saturday but Sunday and today he has had some discomfort. Pt is taking the bentyl, encouraged him to continue with the bentyl and to call us if his symptoms continue or get worse.

## 2012-04-02 ENCOUNTER — Telehealth: Payer: Self-pay | Admitting: Gastroenterology

## 2012-04-02 NOTE — Telephone Encounter (Signed)
He can use Imodium prn.  Has history of IBS.  Thank you,  Jess

## 2012-04-02 NOTE — Telephone Encounter (Signed)
Patient is out of town. He is calling to report diarrhea today. States he has had 10-11 diarrhea stools. Denies cramping, bleeding, nausea, vomiting or fever. States he has taken Bentyl and Kaopectate. He reports he has been eating "rich foods" and maybe that caused diarrhea. He is asking if there is anything else he should take. Hx IBS and hx c.diff. Please, advise.

## 2012-04-02 NOTE — Telephone Encounter (Signed)
Spoke with patient and gave him [name], [place] recommendations.

## 2012-04-07 ENCOUNTER — Telehealth: Payer: Self-pay | Admitting: Internal Medicine

## 2012-04-07 NOTE — Telephone Encounter (Signed)
Pt states that he is having problems with his IBS. Pt has Levbid and Bentyl to take but states it is not helping. Pt states that he is usually good in the am but by the afternoon he has abdominal pain below his belly button. Pt scheduled to see Dr. Marina Goodell tomorrow at 10am. Pt aware of appt date and time.

## 2012-04-08 ENCOUNTER — Other Ambulatory Visit (INDEPENDENT_AMBULATORY_CARE_PROVIDER_SITE_OTHER): Payer: 59

## 2012-04-08 ENCOUNTER — Ambulatory Visit (INDEPENDENT_AMBULATORY_CARE_PROVIDER_SITE_OTHER): Payer: 59 | Admitting: Internal Medicine

## 2012-04-08 ENCOUNTER — Encounter: Payer: Self-pay | Admitting: Internal Medicine

## 2012-04-08 VITALS — BP 116/80 | HR 60 | Ht 68.75 in | Wt 192.5 lb

## 2012-04-08 DIAGNOSIS — K589 Irritable bowel syndrome without diarrhea: Secondary | ICD-10-CM

## 2012-04-08 DIAGNOSIS — R197 Diarrhea, unspecified: Secondary | ICD-10-CM

## 2012-04-08 DIAGNOSIS — R1084 Generalized abdominal pain: Secondary | ICD-10-CM

## 2012-04-08 LAB — IGA: IgA: 293 mg/dL (ref 68–378)

## 2012-04-08 MED ORDER — RIFAXIMIN 550 MG PO TABS: 550.0000 mg | ORAL_TABLET | Freq: Three times a day (TID) | ORAL | Status: DC

## 2012-04-08 NOTE — Patient Instructions (Addendum)
You have been given a separate informational sheet regarding your tobacco use, the importance of quitting and local resources to help you quit.  Your physician has requested that you go to the basement for lab work before leaving today  We have sent the following medications to your pharmacy for you to pick up at your convenience:  Dickie La  Please follow up with Dr. Marina Goodell in 3 months

## 2012-04-08 NOTE — Progress Notes (Signed)
HISTORY OF PRESENT ILLNESS:  Bradley [name] is a 52 y.o. male with a history of diabetes, hypertension, hyperlipidemia, and diarrhea predominant irritable bowel syndrome. His history dates back to early 2007 when he was diagnosed with Clostridium difficile associated diarrhea. He improved after treatment. Thereafter he developed problems with postprandial abdominal discomfort urgency and loose stools. He has had varying degrees of difficulty with this since. He did have complete colonoscopy in 2008 including random biopsies. In October of 2013 he had multiple stool studies which were negative. He has had questionable response to Flagyl at times. No improvement in pain with antispasmodics. He did have a CT scan of the abdomen and pelvis 11/10/2011. This was normal. Comes in today with ongoing complaints. She tells me that he normally has 2-3 bowel movements per day. Occasionally more. Bowels tend to be loose. Several morning and several after lunch. No nocturnal symptoms. No weight loss. No bleeding. No fevers. He is very anxious that there may be something sinister causing his symptoms. He repeat this multiple times. I have reviewed his workup with him. Symptoms seem to be exacerbated by stress and certain meals. He does better with activity  REVIEW OF SYSTEMS:  All non-GI ROS negative except for anxiety  Past Medical History  Diagnosis Date  . Hyperlipidemia   . Diabetes mellitus without mention of complication   . Hypertension   . Internal hemorrhoids without mention of complication 01/02/2006  . IBS (irritable bowel syndrome)   . C. difficile colitis     Past Surgical History  Procedure Laterality Date  . Tonsillectomy    . Tumor removal  2002    from neck  . Knee surgery Right     Social History Bradley [name]  reports that he has never smoked. His smokeless tobacco use includes Chew. He reports that  drinks alcohol. He reports that he does not use illicit drugs.  family  history includes Diabetes in his father and Heart disease in his mother.  There is no history of Colon cancer.  No Known Allergies     PHYSICAL EXAMINATION: Vital signs: BP 116/80  Pulse 60  Ht 5' 8.75" (1.746 m)  Wt 192 lb 8 oz (87.317 kg)  BMI 28.64 kg/m2 General: Well-developed, well-nourished, no acute distress HEENT: Sclerae are anicteric, conjunctiva pink. Oral mucosa intact Lungs: Clear Heart: Regular Abdomen: soft, nontender, nondistended, no obvious ascites, no peritoneal signs, normal bowel sounds. No organomegaly. Extremities: No edema Psychiatric: alert and oriented x3. Cooperative   ASSESSMENT:  #1. 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 Anxiety   PLAN:  #1. Celiac sprue panel and serum IgA #2. Xifaxan 550 mg by mouth 3 times a day x2 weeks. May use on demand if helpful #3. LONG discussion of IBS. Additionally, literature provided and he was directed toward a educational website. #4. Routine GI followup in 3 months.

## 2012-04-09 LAB — GLIA (IGA/G) + TTG IGA: Tissue Transglutaminase Ab, IgA: 4.6 U/mL (ref <20)

## 2012-04-15 ENCOUNTER — Telehealth: Payer: Self-pay | Admitting: Internal Medicine

## 2012-04-15 MED ORDER — ONDANSETRON HCL 4 MG PO TABS: 4.0000 mg | ORAL_TABLET | Freq: Two times a day (BID) | ORAL | Status: DC | PRN

## 2012-04-15 NOTE — Telephone Encounter (Signed)
Bradley [name] pt last OV 04/08/12, pt has IBS. Pt given Xifaxan 550mg  TID for 2 weeks. Pt states that he is having nausea in the mornings, has tried OTC nausea medications but they have not helped. Pt is requesting something for nausea. Dr. Jarold Motto as doc of the day please advise.

## 2012-04-15 NOTE — Telephone Encounter (Signed)
zofran 4mg  q12h prn is ok

## 2012-04-15 NOTE — Telephone Encounter (Signed)
Pt aware and script sent to the pharmacy. 

## 2012-04-28 ENCOUNTER — Telehealth: Payer: Self-pay | Admitting: Internal Medicine

## 2012-04-28 NOTE — Telephone Encounter (Signed)
Pt states he is still having issues with abdominal pain and a lot of bloating. Pt scheduled to see Dr. Marina Goodell Friday 04/30/12@10am . Pt aware of appt date and time.

## 2012-04-30 ENCOUNTER — Ambulatory Visit (INDEPENDENT_AMBULATORY_CARE_PROVIDER_SITE_OTHER): Payer: 59 | Admitting: Internal Medicine

## 2012-04-30 ENCOUNTER — Encounter: Payer: Self-pay | Admitting: Internal Medicine

## 2012-04-30 VITALS — BP 120/80 | HR 51 | Ht 68.6 in | Wt 192.4 lb

## 2012-04-30 DIAGNOSIS — R1084 Generalized abdominal pain: Secondary | ICD-10-CM

## 2012-04-30 DIAGNOSIS — K589 Irritable bowel syndrome without diarrhea: Secondary | ICD-10-CM

## 2012-04-30 DIAGNOSIS — F4323 Adjustment disorder with mixed anxiety and depressed mood: Secondary | ICD-10-CM

## 2012-04-30 MED ORDER — SERTRALINE HCL 50 MG PO TABS: 50.0000 mg | ORAL_TABLET | Freq: Every evening | ORAL | Status: DC | PRN

## 2012-04-30 NOTE — Patient Instructions (Addendum)
We have sent the following medications to your pharmacy for you to pick up at your convenience:  Zoloft  

## 2012-04-30 NOTE — Progress Notes (Signed)
HISTORY OF PRESENT ILLNESS:  Bradley [name] is a 52 y.o. male with diabetes mellitus, hypertension, hyperlipidemia, and diarrhea predominant irritable bowel syndrome, felt to be postinfectious and dating back to 2007. He was last seen in the office 04/09/2012. His principal problem was abdominal pain and altered bowel habits secondary to IBS. As well, significant health related anxiety. I spent greater than 30 minutes counseling the patient as well as providing and literature on the topics and referral to educational websites. Screening for celiac sprue was negative. He was treated empirically with Xifaxan x2 weeks and asked to followup in 3 months. However, he contacted the office to schedule this appointment. He continues to have the same symptoms. No response to Xifaxan or probiotic Align. He still has great concerns that conditions other than IBS responsible for his symptoms. He continues to reiterate that his discomfort stop prevent him from his usual activities. His weight remains stable. No alarm features. Problem seems to be most noticeable at night no does not prevent him from sleeping or interrupts sleep. Negative colonoscopy with biopsies March 2008 and negative CT scan of the abdomen and pelvis November 2013, and unremarkable extensive blood work including celiac testing.  REVIEW OF SYSTEMS:  All non-GI ROS negative except for anxiety  Past Medical History  Diagnosis Date  . Hyperlipidemia   . Diabetes mellitus without mention of complication   . Hypertension   . Internal hemorrhoids without mention of complication 01/02/2006  . IBS (irritable bowel syndrome)   . C. difficile colitis     Past Surgical History  Procedure Laterality Date  . Tonsillectomy    . Tumor removal  2002    from neck  . Knee surgery Right     Social History [name]  reports that he has never smoked. His smokeless tobacco use includes Chew. He reports that  drinks alcohol. He reports that he  does not use illicit drugs.  family history includes Diabetes in his father and Heart disease in his mother.  There is no history of Colon cancer.  No Known Allergies     PHYSICAL EXAMINATION: Vital signs: BP 120/80  Pulse 51  Ht 5' 8.6" (1.742 m)  Wt 192 lb 6 oz (87.261 kg)  BMI 28.76 kg/m2 General: Well-developed, well-nourished, no acute distress HEENT: Sclerae are anicteric, conjunctiva pink. Oral mucosa intact Lungs: Clear Heart: Regular Abdomen: soft, nontender, nondistended, no obvious ascites, no peritoneal signs, normal bowel sounds. No organomegaly. Extremities: No edema Psychiatric: alert and oriented x3. Cooperative   ASSESSMENT:  #1. Diarrhea predominant IBS, postinfectious dating back to 2007. Ongoing #2. Anxiety, health related and otherwise. Significant factor   PLAN:  #1. Long discussion again today on the known pathophysiology of IBS. As well, limitations with regards to treatment #2. Offered, and encouraged, the opportunity to be seen at a tertiary care center for second opinion. He declines #3. Discussed other options. To that end, we will initiate Zoloft 50 mg at night. I offered to have him come see me in the office in 4 weeks. However he prefers to contact my nurse regarding his progress. At that time, he can decide if his therapy needs to be adjusted. He noticed contact the office in the interim for any questions or problems.

## 2012-05-04 ENCOUNTER — Encounter (HOSPITAL_COMMUNITY): Payer: Self-pay | Admitting: Emergency Medicine

## 2012-05-04 ENCOUNTER — Emergency Department (HOSPITAL_COMMUNITY)
Admission: EM | Admit: 2012-05-04 | Discharge: 2012-05-04 | Disposition: A | Discharge status: Home / Self Care | Payer: 59 | Attending: Emergency Medicine | Admitting: Emergency Medicine

## 2012-05-04 DIAGNOSIS — Z8719 Personal history of other diseases of the digestive system: Secondary | ICD-10-CM | POA: Insufficient documentation

## 2012-05-04 DIAGNOSIS — Z8679 Personal history of other diseases of the circulatory system: Secondary | ICD-10-CM | POA: Insufficient documentation

## 2012-05-04 DIAGNOSIS — I10 Essential (primary) hypertension: Secondary | ICD-10-CM | POA: Insufficient documentation

## 2012-05-04 DIAGNOSIS — Z79899 Other long term (current) drug therapy: Secondary | ICD-10-CM | POA: Insufficient documentation

## 2012-05-04 DIAGNOSIS — R0789 Other chest pain: Secondary | ICD-10-CM

## 2012-05-04 DIAGNOSIS — R079 Chest pain, unspecified: Secondary | ICD-10-CM | POA: Insufficient documentation

## 2012-05-04 DIAGNOSIS — E785 Hyperlipidemia, unspecified: Secondary | ICD-10-CM | POA: Insufficient documentation

## 2012-05-04 DIAGNOSIS — E119 Type 2 diabetes mellitus without complications: Secondary | ICD-10-CM | POA: Insufficient documentation

## 2012-05-04 LAB — COMPREHENSIVE METABOLIC PANEL
AST: 20 U/L (ref 0–37)
Albumin: 4.5 g/dL (ref 3.5–5.2)
Alkaline Phosphatase: 88 U/L (ref 39–117)
Chloride: 96 mEq/L (ref 96–112)
Potassium: 3.6 mEq/L (ref 3.5–5.1)
Sodium: 133 mEq/L — ABNORMAL LOW (ref 135–145)
Total Bilirubin: 0.8 mg/dL (ref 0.3–1.2)
Total Protein: 7.8 g/dL (ref 6.0–8.3)

## 2012-05-04 LAB — CBC WITH DIFFERENTIAL/PLATELET
Basophils Absolute: 0.1 10*3/uL (ref 0.0–0.1)
Basophils Relative: 1 % (ref 0–1)
Eosinophils Absolute: 0 10*3/uL (ref 0.0–0.7)
Hemoglobin: 15.4 g/dL (ref 13.0–17.0)
MCHC: 35.8 g/dL (ref 30.0–36.0)
Neutro Abs: 5.1 10*3/uL (ref 1.7–7.7)
Neutrophils Relative %: 71 % (ref 43–77)
Platelets: 290 10*3/uL (ref 150–400)
RDW: 13.1 % (ref 11.5–15.5)

## 2012-05-04 LAB — URINALYSIS, MICROSCOPIC ONLY
Bilirubin Urine: NEGATIVE
Glucose, UA: NEGATIVE mg/dL
Ketones, ur: NEGATIVE mg/dL
Leukocytes, UA: NEGATIVE
Specific Gravity, Urine: 1.031 — ABNORMAL HIGH (ref 1.005–1.030)
pH: 5.5 (ref 5.0–8.0)

## 2012-05-04 LAB — POCT I-STAT TROPONIN I: Troponin i, poc: 0 ng/mL (ref 0.00–0.08)

## 2012-05-04 NOTE — ED Notes (Addendum)
Pt presenting to ed with c/o burning in his chest pt states he is seeing gastroenterologist for his IBS but he's still having burning sensation. Pt states he's still having pain and diarrhea. Pt denies nausea and vomiting at this time. Pt states he also feels very anxious and nervous and this could be anxiety

## 2012-05-04 NOTE — ED Provider Notes (Signed)
History     CSN: [id number]  Arrival date & time 05/04/12  0721   First MD Initiated Contact with Patient 05/04/12 [phone]      Chief Complaint  Patient presents with  . Abdominal Pain    (Consider location/radiation/quality/duration/timing/severity/associated sxs/prior treatment) HPI Comments: 52 y.o. Male with PMHx of IBS (2007), chronic abdominal issues, DM, HTN, HLD presents with concern over chest pain he felt about 10pm last night. While he was here, pt wanted to "pick my brain" about is chronic abdominal pain. Pt is seen frequently by PCP for his abdominal issues.   Pt denies pain or distress at this time. Pt denies shortness of breath, diaphoresis, radiation of pain to jaw, back, arms, nausea/vomiting, headache, dizziness, numbness. No heart hx although pt does see a cardiologist, "just to check."    Note from PCP from 4 days ago:  CC abdominal pain and altered bowel habits secondary to IBS,  significant health related anxiety. I spent greater than 30 minutes counseling the patient as well as providing and literature on the topics and referral to educational websites. Screening for celiac sprue was negative. He was treated empirically with Xifaxan x2 weeks and asked to followup in 3 months. However, he contacted the office to schedule this appointment. He continues to have the same symptoms. No response to Xifaxan or probiotic Align. He still has great concerns that conditions other than IBS responsible for his symptoms. He continues to reiterate that his discomfort stop prevent him from his usual activities. His weight remains stable. No alarm features. Problem seems to be most noticeable at night no does not prevent him from sleeping or interrupts sleep. Negative colonoscopy with biopsies March 2008 and negative CT scan of the abdomen and pelvis November 2013, and unremarkable extensive blood work including celiac testing.   Patient is a 52 y.o. male presenting with abdominal pain.   Abdominal Pain Associated symptoms: no chest pain, no constipation, no diarrhea, no dysuria, no fever, no nausea, no shortness of breath and no vomiting     Past Medical History  Diagnosis Date  . Hyperlipidemia   . Diabetes mellitus without mention of complication   . Hypertension   . Internal hemorrhoids without mention of complication 01/02/2006  . IBS (irritable bowel syndrome)   . C. difficile colitis     Past Surgical History  Procedure Laterality Date  . Tonsillectomy    . Tumor removal  2002    from neck  . Knee surgery Right     Family History  Problem Relation Age of Onset  . Heart disease Mother   . Diabetes Father   . Colon cancer Neg Hx     History  Substance Use Topics  . Smoking status: Never Smoker   . Smokeless tobacco: Current User    Types: Chew  . Alcohol Use: Yes      Review of Systems  Constitutional: Negative for fever and diaphoresis.  HENT: Negative for neck pain and neck stiffness.   Eyes: Negative for visual disturbance.  Respiratory: Negative for apnea, chest tightness and shortness of breath.   Cardiovascular: Negative for chest pain and palpitations.  Gastrointestinal: Positive for abdominal pain. Negative for nausea, vomiting, diarrhea and constipation.  Genitourinary: Negative for dysuria.  Musculoskeletal: Negative for gait problem.  Skin: Negative for rash.  Neurological: Negative for dizziness, weakness, light-headedness, numbness and headaches.    Allergies  Review of patient's allergies indicates no known allergies.  Home Medications   Current Outpatient Rx  Name  Route  Sig  Dispense  Refill  . ALPRAZolam (XANAX) 0.5 MG tablet   Oral   Take 0.5 mg by mouth as needed for sleep.         Marland Kitchen atorvastatin (LIPITOR) 40 MG tablet   Oral   Take 40 mg by mouth daily.           . nadolol (CORGARD) 20 MG tablet   Oral   Take by mouth 2 (two) times daily. Take 1/2 tablet once daily         . ondansetron (ZOFRAN) 4  MG tablet   Oral   Take 1 tablet (4 mg total) by mouth every 12 (twelve) hours as needed for nausea.   30 tablet   1   . pioglitazone (ACTOS) 15 MG tablet   Oral   Take 15 mg by mouth daily.           . sertraline (ZOLOFT) 50 MG tablet   Oral   Take 1 tablet (50 mg total) by mouth at bedtime as needed.   30 tablet   3   . valsartan-hydrochlorothiazide (DIOVAN-HCT) 160-12.5 MG per tablet   Oral   Take 1 tablet by mouth daily.           BP 166/97  Pulse 60  Temp(Src) 98.4 F (36.9 C) (Oral)  Resp 22  SpO2 98%  Physical Exam  Nursing note and vitals reviewed. Constitutional: He is oriented to person, place, and time. He appears well-developed and well-nourished. No distress.  HENT:  Head: Normocephalic and atraumatic.  Eyes: Conjunctivae and EOM are normal.  Neck: Normal range of motion. Neck supple.  No meningeal signs  Cardiovascular: Normal rate, regular rhythm and normal heart sounds.  Exam reveals no gallop and no friction rub.   No murmur heard. Pulmonary/Chest: Effort normal and breath sounds normal. No respiratory distress. He has no wheezes. He has no rales. He exhibits no tenderness.  Abdominal: Soft. Bowel sounds are normal. He exhibits no distension. There is no tenderness. There is no rebound and no guarding.  Musculoskeletal: Normal range of motion. He exhibits no edema and no tenderness.  Neurological: He is alert and oriented to person, place, and time. No cranial nerve deficit.  Skin: Skin is warm and dry. He is not diaphoretic. No erythema.  Psychiatric:  anxious    ED Course  Procedures (including critical care time)  Labs Reviewed  COMPREHENSIVE METABOLIC PANEL - Abnormal; Notable for the following:    Sodium 133 (*)    Glucose, Bld 158 (*)    All other components within normal limits  URINALYSIS, MICROSCOPIC ONLY - Abnormal; Notable for the following:    Color, Urine AMBER (*)    APPearance CLOUDY (*)    Specific Gravity, Urine 1.031  (*)    All other components within normal limits  CBC WITH DIFFERENTIAL  LIPASE, BLOOD  POCT I-STAT TROPONIN I   No results found.  Date: 05/04/2012  Rate: 47  Rhythm: sinus brady  QRS Axis: normal  Intervals: normal  ST/T Wave abnormalities: non specific T wave changes  Conduction Disutrbances: none  Narrative Interpretation: abnormal EKG  Old EKG Reviewed: None available   1. Non-cardiac chest pain       MDM  Patient is to be discharged with recommendation to follow up with PCP in regards to today's hospital visit. Chest pain is not likely of cardiac or pulmonary etiology d/t presentation, perc negative, VSS, no tracheal deviation, no JVD  or new murmur, RRR, breath sounds equal bilaterally, EKG without acute abnormalities, negative troponin, and negative CXR. Pt has been advised to return to the ED if CP becomes exertional, associated with diaphoresis or nausea, radiates to left jaw/arm, worsens or becomes concerning in any way. Pt appears reliable for follow up and is agreeable to discharge.   Glade Nurse, PA-C 05/04/12 1641

## 2012-05-04 NOTE — ED Provider Notes (Signed)
History/physical exam/procedure(s) were performed by non-physician practitioner and as supervising physician I was immediately available for consultation/collaboration. I have reviewed all notes and am in agreement with care and plan.   Kenshin Splawn S Quin Mathenia, MD 05/04/12 1642 

## 2012-08-29 ENCOUNTER — Encounter (HOSPITAL_COMMUNITY): Payer: Self-pay | Admitting: Emergency Medicine

## 2012-08-29 ENCOUNTER — Emergency Department (HOSPITAL_COMMUNITY)
Admission: EM | Admit: 2012-08-29 | Discharge: 2012-08-29 | Disposition: A | Discharge status: Home / Self Care | Payer: 59 | Attending: Emergency Medicine | Admitting: Emergency Medicine

## 2012-08-29 DIAGNOSIS — R0789 Other chest pain: Secondary | ICD-10-CM | POA: Insufficient documentation

## 2012-08-29 DIAGNOSIS — K589 Irritable bowel syndrome without diarrhea: Secondary | ICD-10-CM | POA: Insufficient documentation

## 2012-08-29 DIAGNOSIS — Z8619 Personal history of other infectious and parasitic diseases: Secondary | ICD-10-CM | POA: Insufficient documentation

## 2012-08-29 DIAGNOSIS — I498 Other specified cardiac arrhythmias: Secondary | ICD-10-CM | POA: Insufficient documentation

## 2012-08-29 DIAGNOSIS — F411 Generalized anxiety disorder: Secondary | ICD-10-CM | POA: Insufficient documentation

## 2012-08-29 DIAGNOSIS — E785 Hyperlipidemia, unspecified: Secondary | ICD-10-CM | POA: Insufficient documentation

## 2012-08-29 DIAGNOSIS — F419 Anxiety disorder, unspecified: Secondary | ICD-10-CM

## 2012-08-29 DIAGNOSIS — Z79899 Other long term (current) drug therapy: Secondary | ICD-10-CM | POA: Insufficient documentation

## 2012-08-29 DIAGNOSIS — Z8679 Personal history of other diseases of the circulatory system: Secondary | ICD-10-CM | POA: Insufficient documentation

## 2012-08-29 DIAGNOSIS — I10 Essential (primary) hypertension: Secondary | ICD-10-CM | POA: Insufficient documentation

## 2012-08-29 DIAGNOSIS — E119 Type 2 diabetes mellitus without complications: Secondary | ICD-10-CM

## 2012-08-29 LAB — CBC
MCH: 29.4 pg (ref 26.0–34.0)
MCV: 85 fL (ref 78.0–100.0)
Platelets: 291 10*3/uL (ref 150–400)
RBC: 5.13 MIL/uL (ref 4.22–5.81)
RDW: 13.3 % (ref 11.5–15.5)
WBC: 7 10*3/uL (ref 4.0–10.5)

## 2012-08-29 LAB — POCT I-STAT TROPONIN I: Troponin i, poc: 0.01 ng/mL (ref 0.00–0.08)

## 2012-08-29 LAB — BASIC METABOLIC PANEL
CO2: 29 mEq/L (ref 19–32)
Calcium: 10.3 mg/dL (ref 8.4–10.5)
Creatinine, Ser: 1 mg/dL (ref 0.50–1.35)
GFR calc Af Amer: >90 mL/min (ref >90)
GFR calc non Af Amer: 85 mL/min — ABNORMAL LOW (ref >90)
Sodium: 132 mEq/L — ABNORMAL LOW (ref 135–145)

## 2012-08-29 LAB — PRO B NATRIURETIC PEPTIDE: Pro B Natriuretic peptide (BNP): 143.4 pg/mL — ABNORMAL HIGH (ref 0–125)

## 2012-08-29 MED ORDER — LORAZEPAM 1 MG PO TABS
1.0000 mg | ORAL_TABLET | Freq: Once | ORAL | Status: AC
  Administered 2012-08-29: 1 mg via ORAL
  Filled 2012-08-29: qty 1

## 2012-08-29 NOTE — ED Provider Notes (Signed)
CSN: [id number]     Arrival date & time 08/29/12  1239 History     First MD Initiated Contact with Patient 08/29/12 1515     Chief Complaint  Patient presents with  . Chest Pain   (Consider location/radiation/quality/duration/timing/severity/associated sxs/prior Treatment) Patient is a 52 y.o. male presenting with chest pain. The history is provided by the patient and medical records. No language interpreter was used.  Chest Pain Associated symptoms: no abdominal pain, no back pain, no cough, no diaphoresis, no fatigue, no fever, no headache, no nausea, no shortness of breath and not vomiting     Bradley [name] is a 52 y.o. male  with a hx of DM, HTN, anxiety, IBS presents to the Emergency Department complaining of very short (lasting < 3 sec), intermittent sharp LUQ abd / LL chest pain located just under his Left ribcage with a total of 5 episodes sine Friday morning.  Pt states he has had theses very same symptoms in the past and has been evaluated by the ED, his PCP and a cardiologist without evidence of cardiac disease. He reports he usually gets these pains when he is anxious and riding his bike makes him feel better.  He is currently taking Xanax 1mg  BID for his anxiety but he thinks it is not working. He states he supposed to travel for work this afternoon and he has felt anxious about this.  Pt denies associated nausea, SOB, DOE, diaphoresis, lightheadedness.  He endorses no other associated symptoms.  Nothing makes it better and nothing makes it worse.  Pt denies , chills, headache, neck pain shortness of breath, nausea, vomiting, diarrhea, weakness, dizziness, syncope, dysuria, hematuria.     Past Medical History  Diagnosis Date  . Hyperlipidemia   . Diabetes mellitus without mention of complication   . Hypertension   . Internal hemorrhoids without mention of complication 01/02/2006  . IBS (irritable bowel syndrome)   . C. difficile colitis    Past Surgical History   Procedure Laterality Date  . Tonsillectomy    . Tumor removal  2002    from neck  . Knee surgery Right    Family History  Problem Relation Age of Onset  . Heart disease Mother   . Diabetes Father   . Colon cancer Neg Hx    History  Substance Use Topics  . Smoking status: Never Smoker   . Smokeless tobacco: Current User    Types: Chew  . Alcohol Use: Yes    Review of Systems  Constitutional: Negative for fever, diaphoresis, appetite change, fatigue and unexpected weight change.  HENT: Negative for mouth sores and neck stiffness.   Eyes: Negative for visual disturbance.  Respiratory: Negative for cough, chest tightness, shortness of breath and wheezing.   Cardiovascular: Positive for chest pain.  Gastrointestinal: Negative for nausea, vomiting, abdominal pain, diarrhea and constipation.  Endocrine: Negative for polydipsia, polyphagia and polyuria.  Genitourinary: Negative for dysuria, urgency, frequency and hematuria.  Musculoskeletal: Negative for back pain.  Skin: Negative for rash.  Allergic/Immunologic: Negative for immunocompromised state.  Neurological: Negative for syncope, light-headedness and headaches.  Hematological: Does not bruise/bleed easily.  Psychiatric/Behavioral: Negative for sleep disturbance. The patient is nervous/anxious.     Allergies  Review of patient's allergies indicates no known allergies.  Home Medications   Current Outpatient Rx  Name  Route  Sig  Dispense  Refill  . ALPRAZolam (XANAX) 0.5 MG tablet   Oral   Take 0.5 mg by mouth as needed  for anxiety.          Marland Kitchen atorvastatin (LIPITOR) 40 MG tablet   Oral   Take 40 mg by mouth daily.           . nadolol (CORGARD) 20 MG tablet   Oral   Take 10 mg by mouth 2 (two) times daily.         . pioglitazone (ACTOS) 15 MG tablet   Oral   Take 15 mg by mouth daily.           . valsartan-hydrochlorothiazide (DIOVAN-HCT) 160-12.5 MG per tablet   Oral   Take 1 tablet by mouth  daily.          BP 155/88  Pulse 54  Temp(Src) 97.9 F (36.6 C) (Oral)  SpO2 97% Physical Exam  Nursing note and vitals reviewed. Constitutional: He appears well-developed and well-nourished. No distress.  Awake, alert, nontoxic appearance  HENT:  Head: Normocephalic and atraumatic.  Mouth/Throat: Oropharynx is clear and moist. No oropharyngeal exudate.  Eyes: Conjunctivae and EOM are normal. Pupils are equal, round, and reactive to light. No scleral icterus.  Neck: Normal range of motion. Neck supple.  Cardiovascular: Regular rhythm, S1 normal, S2 normal, normal heart sounds and intact distal pulses.  Bradycardia present.   No murmur heard. Pulses:      Radial pulses are 2+ on the right side, and 2+ on the left side.       Dorsalis pedis pulses are 2+ on the right side, and 2+ on the left side.       Posterior tibial pulses are 2+ on the right side, and 2+ on the left side.  Pulmonary/Chest: Effort normal and breath sounds normal. No accessory muscle usage. Not tachypneic. No respiratory distress. He has no decreased breath sounds. He has no wheezes. He has no rhonchi. He has no rales.  Clear and equal breath sounds throughout No wheezes, rhonchi or rales  Abdominal: Soft. Normal appearance and bowel sounds are normal. He exhibits no mass. There is no tenderness. There is no rigidity, no rebound, no guarding and no CVA tenderness.  Abdomen soft and nontender No CVA tenderness  Musculoskeletal: Normal range of motion. He exhibits no edema.  Lymphadenopathy:       Head (right side): No submental, no submandibular, no tonsillar, no preauricular, no posterior auricular and no occipital adenopathy present.       Head (left side): No submental, no submandibular, no tonsillar, no preauricular, no posterior auricular and no occipital adenopathy present.    He has no cervical adenopathy.  Neurological: He is alert. GCS eye subscore is 4. GCS verbal subscore is 5. GCS motor subscore is 6.   Speech is clear and goal oriented Moves extremities without ataxia  Skin: Skin is warm, dry and intact. He is not diaphoretic.  Psychiatric: His mood appears anxious.  Patient is anxious on exam, continuing to ask if he is okay    ED Course   Procedures (including critical care time)  Labs Reviewed  BASIC METABOLIC PANEL - Abnormal; Notable for the following:    Sodium 132 (*)    Chloride 95 (*)    Glucose, Bld 133 (*)    GFR calc non Af Amer 85 (*)    All other components within normal limits  PRO B NATRIURETIC PEPTIDE - Abnormal; Notable for the following:    Pro B Natriuretic peptide (BNP) 143.4 (*)    All other components within normal limits  CBC  POCT  I-STAT TROPONIN I   ECG:  Date: 08/29/2012  Rate: 54  Rhythm: sinus bradycardia  QRS Axis: normal  Intervals: normal  ST/T Wave abnormalities: nonspecific T wave changes  Conduction Disutrbances:none  Narrative Interpretation: inverted T waves III, unchanged from 05/04/12  Old EKG Reviewed: unchanged    No results found. 1. Atypical chest pain   2. Anxiety   3. IBS (irritable bowel syndrome)   4. DM   5. HYPERTENSION     MDM  Bradley [name] presents with atypical chest pain and anxiety.  Patient presents to the emergency department complaining of symptoms consistent with his normal anxiety.  Patient has a history of same with similar episodes.  The patient is resting comfortably, in no apparent distress and asymptomatic.  Labs, ECG and vital signs reviewed.  No exophthalmos.  Chest pain is not likely of cardiac or pulmonary etiology d/t presentation, PERC negative, VSS, no tracheal deviation, no JVD or new murmur, RRR, breath sounds equal bilaterally, no hypoxia, EKG without acute abnormalities, negative troponin. Pt has been advised increase his Xanax dose and return to the ED if CP becomes exertional, associated with diaphoresis or nausea, radiates to left jaw/arm, worsens or becomes concerning in any way.  Stress reducing mechanisms discussed including caffeine intake and cessation of his nicotine dip. Recommend f/u with PCP.  Pt appears reliable for follow up and is agreeable to discharge. I have also discussed reasons to return immediately to the ER.  Patient expresses understanding and agrees with plan.  BP 155/88  Pulse 54  Temp(Src) 97.9 F (36.6 C) (Oral)  SpO2 97%     Dierdre Forth, PA-C 08/29/12 1615

## 2012-08-29 NOTE — ED Notes (Signed)
Pt states that he has been here before for CP and it was anxiety.  States he knows it is anxiety again and states he is a "hypochondriac". Pt appears very nervous in triage.

## 2012-08-29 NOTE — ED Provider Notes (Signed)
Medical screening examination/treatment/procedure(s) were performed by non-physician practitioner and as supervising physician I was immediately available for consultation/collaboration.   Lyanne Co, MD 08/29/12 [phone]

## 2013-01-04 ENCOUNTER — Ambulatory Visit (INDEPENDENT_AMBULATORY_CARE_PROVIDER_SITE_OTHER): Payer: 59 | Admitting: Gastroenterology

## 2013-01-04 ENCOUNTER — Other Ambulatory Visit (INDEPENDENT_AMBULATORY_CARE_PROVIDER_SITE_OTHER): Payer: 59

## 2013-01-04 ENCOUNTER — Encounter: Payer: Self-pay | Admitting: Gastroenterology

## 2013-01-04 VITALS — BP 128/76 | HR 60 | Ht 68.75 in | Wt 198.8 lb

## 2013-01-04 DIAGNOSIS — R194 Change in bowel habit: Secondary | ICD-10-CM

## 2013-01-04 DIAGNOSIS — R198 Other specified symptoms and signs involving the digestive system and abdomen: Secondary | ICD-10-CM

## 2013-01-04 DIAGNOSIS — K589 Irritable bowel syndrome without diarrhea: Secondary | ICD-10-CM

## 2013-01-04 MED ORDER — DICYCLOMINE HCL 10 MG PO CAPS: ORAL_CAPSULE | ORAL | Status: DC

## 2013-01-04 NOTE — Patient Instructions (Signed)
Please go to the basement level to have your labs drawn.  Take Citrucel daily. We have given you a special diet.  We will send a presription for Bentyl 10 mg twice daily, to [facility].  Take Imodium regularly.

## 2013-01-04 NOTE — Progress Notes (Signed)
Patient Well-known to me. Agree with initial assessment and plans

## 2013-01-04 NOTE — Progress Notes (Signed)
01/04/2013 ABHAY GODBOLT [id number] 1960-07-15   History of Present Illness:  Bradley [name] is a 52 y.o. male who is known to Dr. 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  He was last seen in the office 04/30/2012.  He also has significant health related anxiety.  Screening for celiac sprue was negative. He was treated empirically with Xifaxan in the past but had no improvement in symptoms with that.  He took Librarian, academic in the past and has just restarted that again.  No significant symptom relief with levbid in the past.  Negative colonoscopy with biopsies March 2008 and negative CT scan of the abdomen and pelvis November 2013, and unremarkable extensive blood work including celiac testing as stated above.  He was given Zoloft at his last visit in April, but he is not taking that medication currently.  He comes in today with the same complaints of alternating diarrhea and constipation, but primarily diarrhea.  Gets diffuse abdominal pains/gas pains.  Says that symptoms have been worse again for the past 2 weeks or so.  Does not wake him from sleep at night.  Pleas Koch a lot for work and wanted to see if there is anything else that he can do or take for his symptoms.    Current Medications, Allergies, Past Medical History, Past Surgical History, Family History and Social History were reviewed in [employer] record.   Physical Exam: BP 128/76  Pulse 60  Ht 5' 8.75" (1.746 m)  Wt 198 lb 12.8 oz (90.175 kg)  BMI 29.58 kg/m2 General: Well developed white male in no acute distress Head: Normocephalic and atraumatic Eyes:  Sclerae anicteric, conjunctiva pink  Ears: Normal auditory acuity Lungs: Clear throughout to auscultation Heart: Regular rate and rhythm Abdomen: Soft, non-distended. No masses, no hepatomegaly. Normal bowel sounds.   Non-tender. Musculoskeletal: Symmetrical with no gross deformities  Extremities: No edema  Neurological: Alert oriented x 4, grossly non-focal Psychological:  Alert and cooperative. Normal mood and affect  Assessment and Recommendations: -IBS:  Some alternating constipation and diarrhea, but predominantly diarrhea with bloating and abdominal discomfort.  We will give him the FODMAP diet to try if possible for him with his travelling and work schedule.  Will start taking citrucel daily.  Will continue to use Imodium, but will try to regulate it and use it on a somewhat regular basis to prevent diarrhea rather than wait until he has diarrhea (needs to find a balance so that he does not get constipated either).  Will check a TSH.  Will start Bentyl 10 mg BID. -Anxiety:  Health related and otherwise.

## 2013-02-07 ENCOUNTER — Encounter: Payer: Self-pay | Admitting: *Deleted

## 2013-02-08 ENCOUNTER — Ambulatory Visit (INDEPENDENT_AMBULATORY_CARE_PROVIDER_SITE_OTHER): Payer: 59 | Admitting: Internal Medicine

## 2013-02-08 ENCOUNTER — Encounter: Payer: Self-pay | Admitting: Internal Medicine

## 2013-02-08 VITALS — BP 132/82 | HR 43 | Ht 69.25 in | Wt 193.7 lb

## 2013-02-08 DIAGNOSIS — I498 Other specified cardiac arrhythmias: Secondary | ICD-10-CM

## 2013-02-08 DIAGNOSIS — I10 Essential (primary) hypertension: Secondary | ICD-10-CM

## 2013-02-08 DIAGNOSIS — F411 Generalized anxiety disorder: Secondary | ICD-10-CM

## 2013-02-08 DIAGNOSIS — R001 Bradycardia, unspecified: Secondary | ICD-10-CM | POA: Insufficient documentation

## 2013-02-08 DIAGNOSIS — E785 Hyperlipidemia, unspecified: Secondary | ICD-10-CM

## 2013-02-08 DIAGNOSIS — F419 Anxiety disorder, unspecified: Secondary | ICD-10-CM | POA: Insufficient documentation

## 2013-02-08 NOTE — Progress Notes (Signed)
OFFICE NOTE  Chief Complaint:  IBS complaints  Primary Care Physician: Bradley Fillers, MD  HPI:  Bradley [name] is a 53 year old gentleman with a history of hypertension and non-anginal chest pain. He had a nuclear stress test, which was negative, and has continued to feel well. He was ultimately diagnosed with IBS and has various complaints related to this. He continues to exercise, runs about a total of 10 miles a week, and is asymptomatic. No shortness of breath, palpitations, presyncope, or syncopal symptoms. His main issue is ongoing anxiety which she reports as significant. He is been in the hospital apparently a couple times in the emergency room with various complaints of chest discomfort and abdominal pain and ultimately was sent home with increasing doses of Xanax. He reports his Xanax does not particularly help him. He's never tried any SSRI or SNRI medication for his anxiety which does interfere with his lifestyle.  He has never sought counseling or psychiatric help.  Finally today, he is noted to have a marked sinus bradycardia with a rate in the 40s.  I've noted that he decreased his nadolol to 10 mg twice daily as he was previously only taking 10 mg (one half tablet) daily.  PMHx:  Past Medical History  Diagnosis Date  . Hyperlipidemia   . Diabetes mellitus without mention of complication   . Hypertension   . Internal hemorrhoids without mention of complication 01/02/2006  . IBS (irritable bowel syndrome)   . C. difficile colitis   . Rotator cuff tear     Past Surgical History  Procedure Laterality Date  . Tonsillectomy    . Tumor removal  2002    from neck  . Knee surgery Right   . Nm myocar perf wall motion  10/2009    bruce myoview; defect consistent with diaphragmatic attenuation; EF 54%, normal study    FAMHx:  Family History  Problem Relation Age of Onset  . Heart disease Mother   . Diabetes Father   . Colon cancer Neg Hx   . Heart disease  Brother 57    SOCHx:   reports that he has never smoked. His smokeless tobacco use includes Chew. He reports that he drinks alcohol. He reports that he does not use illicit drugs.  ALLERGIES:  No Known Allergies  ROS: A comprehensive review of systems was negative except for: Gastrointestinal: positive for ibs Behavioral/Psych: positive for anxiety  HOME MEDS: Current Outpatient Prescriptions  Medication Sig Dispense Refill  . ALPRAZolam (XANAX) 0.5 MG tablet Take 0.5 mg by mouth 3 (three) times daily as needed for anxiety.       Marland Kitchen atorvastatin (LIPITOR) 40 MG tablet Take 40 mg by mouth daily.        . Bismuth Subsalicylate (PEPTO-BISMOL PO) Take by mouth as needed.      . hyoscyamine (LEVSIN SL) 0.125 MG SL tablet Take 1 tablet by mouth 3 (three) times daily as needed.      . nadolol (CORGARD) 20 MG tablet Take 10 mg by mouth daily.       . pioglitazone (ACTOS) 15 MG tablet Take 15 mg by mouth daily.        . Simethicone (GAS-X PO) Take by mouth as needed.      . valsartan-hydrochlorothiazide (DIOVAN-HCT) 160-12.5 MG per tablet Take 1 tablet by mouth daily.       No current facility-administered medications for this visit.    LABS/IMAGING: No results found for this or any previous  visit (from the past 48 hour(s)). No results found.  VITALS: BP 132/82  Pulse 43  Ht 5' 9.25" (1.759 m)  Wt 193 lb 11.2 oz (87.862 kg)  BMI 28.40 kg/m2  EXAM: General appearance: alert and mildly anxious Neck: no carotid bruit and no JVD Lungs: clear to auscultation bilaterally Heart: regular rate and rhythm, S1, S2 normal, no murmur, click, rub or gallop Abdomen: soft, non-tender; bowel sounds normal; no masses,  no organomegaly Extremities: extremities normal, atraumatic, no cyanosis or edema Pulses: 2+ and symmetric Skin: Skin color, texture, turgor normal. No rashes or lesions Neurologic: Grossly normal Psych: Anxious  EKG: Marked sinus bradycardia at  42  ASSESSMENT: 1. Anxiety 2. Hypertension-controlled 3. Dyslipidemia 4. Bradycardia  PLAN: 1.   Bradley [name] is doing fairly well is continuing to be able to exercise without any difficulty. Unfortunately he has significant anxiety and may benefit from seeing a psychiatrist or perhaps a trial of Lexapro or Effexor. Also for this to his primary care provider and did provide him a name with a local psychiatrist. In addition, he is noted to have some bradycardia today. He does report some more fatigue recently which could be related to this. I think he would be beneficial to decrease his nadolol back to 10 mg daily. He is instructed to make that change and we'll be happy to see him back annually or sooner as necessary.  Chrystie NoseKenneth C. Hilty, MD, [facility] Attending Cardiologist [facility]  [name],[name] 02/08/2013, 4:28 PM

## 2013-02-08 NOTE — Patient Instructions (Signed)
Your physician has recommended you make the following change in your medication: DECREASE nadolol to 10mg  ONCE DAILY   Your physician wants you to follow-up in: 1 year. You will receive a reminder letter in the mail two months in advance. If you don't receive a letter, please call our office to schedule the follow-up appointment.  Dr. Milagros Evenerupinder Kaur - psychiatry

## 2013-02-21 ENCOUNTER — Ambulatory Visit: Payer: 59 | Admitting: Internal Medicine

## 2013-03-17 ENCOUNTER — Telehealth: Payer: Self-pay | Admitting: Internal Medicine

## 2013-03-17 NOTE — Telephone Encounter (Signed)
Pt states he has had some rectal bleeding. Blood has been on the tissue and colored the toilet bowl. Pt states he feels a "bump" on his rectum. Pt scheduled to see Amy Esterwood PA 03/21/13@11am . Pt aware of appt.

## 2013-03-21 ENCOUNTER — Ambulatory Visit (INDEPENDENT_AMBULATORY_CARE_PROVIDER_SITE_OTHER): Payer: 59 | Admitting: Physician Assistant

## 2013-03-21 ENCOUNTER — Encounter: Payer: Self-pay | Admitting: Physician Assistant

## 2013-03-21 VITALS — BP 134/80 | HR 60 | Ht 68.75 in | Wt 198.0 lb

## 2013-03-21 DIAGNOSIS — K649 Unspecified hemorrhoids: Secondary | ICD-10-CM

## 2013-03-21 DIAGNOSIS — K589 Irritable bowel syndrome without diarrhea: Secondary | ICD-10-CM

## 2013-03-21 MED ORDER — HYDROCORTISONE ACETATE 25 MG RE SUPP: RECTAL | Status: DC

## 2013-03-21 NOTE — Progress Notes (Signed)
Subjective:    Patient ID: Bradley [name], male    DOB: September 20, 1960, 53 y.o.   MRN: [id number]  HPI  [name] is a 53 year old white male known to Dr. Marina GoodellPerry who has diagnosis of hypertension, diabetes mellitus, anxiety, and IBS. He has had problems in the past with alternating constipation and diarrhea. Colonoscopy was done in March of 2008 and was a normal exam.  Patient comes in today with complaints of small-volume rectal bleeding. He says he is seen bright red blood and pinkish blood on the tissue off and on over the past few years a recently had seen a small amount of similar blood in the commode. This has occurred only with bowel movements and says he saw one episode in January and one episode last week. He has no complaints of abdominal pain or rectal pain. He does get migratory discomfort in his abdomen from his IBS. He says he has been having more tendency towards constipation recently. He says he came in because he had not seeing blood in the commode previously.    Review of Systems  Constitutional: Negative.   HENT: Negative.   Eyes: Negative.   Respiratory: Negative.   Cardiovascular: Negative.   Gastrointestinal: Positive for diarrhea, constipation and anal bleeding.  Endocrine: Negative.   Genitourinary: Negative.   Musculoskeletal: Negative.   Skin: Negative.   Allergic/Immunologic: Negative.   Neurological: Negative.   Hematological: Negative.   Psychiatric/Behavioral: Negative.    Outpatient Encounter Prescriptions as of 03/21/2013  Medication Sig  . ALPRAZolam (XANAX) 0.5 MG tablet Take 0.5 mg by mouth 3 (three) times daily as needed for anxiety.   Marland Kitchen. atorvastatin (LIPITOR) 40 MG tablet Take 40 mg by mouth daily.    . Bismuth Subsalicylate (PEPTO-BISMOL PO) Take by mouth as needed.  . hyoscyamine (LEVSIN SL) 0.125 MG SL tablet Take 1 tablet by mouth 3 (three) times daily as needed.  . nadolol (CORGARD) 20 MG tablet Take 10 mg by mouth daily.   . pioglitazone  (ACTOS) 15 MG tablet Take 15 mg by mouth daily.    . Simethicone (GAS-X PO) Take by mouth as needed.  . valsartan-hydrochlorothiazide (DIOVAN-HCT) 160-12.5 MG per tablet Take 1 tablet by mouth daily.  . hydrocortisone (ANUSOL-HC) 25 MG suppository Use 1 suppositories at bedtime for 7 days and repeat as needed for hemorrhoidal bleeding.   No Known Allergies Patient Active Problem List   Diagnosis Date Noted  . Anxiety 02/08/2013  . Bradycardia 02/08/2013  . Diarrhea 10/30/2011  . IBS (irritable bowel syndrome) 05/08/2010  . ABDOMINAL PAIN-GENERALIZED 02/28/2008  . DM 02/24/2008  . HYPERLIPIDEMIA 02/24/2008  . HYPERTENSION 02/24/2008   History  Substance Use Topics  . Smoking status: Never Smoker   . Smokeless tobacco: Current User    Types: Chew  . Alcohol Use: Yes   family history includes Diabetes in his father; Heart disease in his mother; Heart disease (age of onset: 4347) in his brother. There is no history of Colon cancer.     Objective:   Physical Exam well-developed white male in no acute distress, pleasant blood pressure 134/80 pulse 60 height 5 foot 8 weight 198. HEENT; nontraumatic normocephalic EOMI PERRLA sclera anicteric, Supple ;no JVD, Cardiovascular; regular rate and rhythm with S1-S2 no murmur or gallop, Pulmonary; clear bilaterally, Abdomen ;soft nontender nondistended, Rectal ;exam no external hemorrhoidal tags on digital exam he has a small hemorrhoid at the anal verge which is nontender nonbleeding no other lesion felt, Extremities; no clubbing cyanosis or  edema skin warm and dry, Psych; mood and affect appropriate        Assessment & Plan:  #23 53 year old male with IBS now presenting with small-volume hematochezia consistent with hemorrhoidal bleeding, consistent with internal hemorrhoid. #2 diabetes mellitus #3 hypertension #4 anxiety  Plan; Anusol-HC suppositories at bedtime x1 week then repeat as needed for recurrent bleeding Patient is advised to call  or come in for return office visit should bleeding persist or increase and would consider repeat colonoscopy at that time.

## 2013-03-21 NOTE — Patient Instructions (Signed)
We sent a prescription for Anusol HC Suppositories to [facility] [place], [address].  Make a return office visit with Dr. Marina GoodellPerry if bleeding persists and have further problems.

## 2013-03-21 NOTE — Progress Notes (Signed)
Agree with initial assessment and plans 

## 2013-03-29 ENCOUNTER — Encounter (HOSPITAL_COMMUNITY): Payer: Self-pay | Admitting: Emergency Medicine

## 2013-03-29 ENCOUNTER — Emergency Department (HOSPITAL_COMMUNITY)
Admission: EM | Admit: 2013-03-29 | Discharge: 2013-03-29 | Disposition: A | Discharge status: Home / Self Care | Payer: 59 | Attending: Emergency Medicine | Admitting: Emergency Medicine

## 2013-03-29 ENCOUNTER — Emergency Department (HOSPITAL_COMMUNITY): Payer: 59

## 2013-03-29 DIAGNOSIS — Z79899 Other long term (current) drug therapy: Secondary | ICD-10-CM | POA: Insufficient documentation

## 2013-03-29 DIAGNOSIS — IMO0002 Reserved for concepts with insufficient information to code with codable children: Secondary | ICD-10-CM | POA: Insufficient documentation

## 2013-03-29 DIAGNOSIS — E785 Hyperlipidemia, unspecified: Secondary | ICD-10-CM | POA: Insufficient documentation

## 2013-03-29 DIAGNOSIS — E119 Type 2 diabetes mellitus without complications: Secondary | ICD-10-CM | POA: Insufficient documentation

## 2013-03-29 DIAGNOSIS — Z87828 Personal history of other (healed) physical injury and trauma: Secondary | ICD-10-CM | POA: Insufficient documentation

## 2013-03-29 DIAGNOSIS — I498 Other specified cardiac arrhythmias: Secondary | ICD-10-CM | POA: Insufficient documentation

## 2013-03-29 DIAGNOSIS — Z8619 Personal history of other infectious and parasitic diseases: Secondary | ICD-10-CM | POA: Insufficient documentation

## 2013-03-29 DIAGNOSIS — I10 Essential (primary) hypertension: Secondary | ICD-10-CM | POA: Insufficient documentation

## 2013-03-29 DIAGNOSIS — R079 Chest pain, unspecified: Secondary | ICD-10-CM | POA: Insufficient documentation

## 2013-03-29 DIAGNOSIS — K589 Irritable bowel syndrome without diarrhea: Secondary | ICD-10-CM | POA: Insufficient documentation

## 2013-03-29 DIAGNOSIS — F411 Generalized anxiety disorder: Secondary | ICD-10-CM | POA: Insufficient documentation

## 2013-03-29 LAB — CBC
HCT: 42.3 % (ref 39.0–52.0)
HEMOGLOBIN: 14.9 g/dL (ref 13.0–17.0)
MCH: 30 pg (ref 26.0–34.0)
MCHC: 35.2 g/dL (ref 30.0–36.0)
MCV: 85.3 fL (ref 78.0–100.0)
PLATELETS: 269 10*3/uL (ref 150–400)
RBC: 4.96 MIL/uL (ref 4.22–5.81)
RDW: 13.4 % (ref 11.5–15.5)
WBC: 7.2 10*3/uL (ref 4.0–10.5)

## 2013-03-29 LAB — BASIC METABOLIC PANEL
BUN: 19 mg/dL (ref 6–23)
CALCIUM: 10.5 mg/dL (ref 8.4–10.5)
CO2: 27 mEq/L (ref 19–32)
Chloride: 98 mEq/L (ref 96–112)
Creatinine, Ser: 0.88 mg/dL (ref 0.50–1.35)
GFR calc non Af Amer: >90 mL/min (ref >90)
GLUCOSE: 121 mg/dL — AB (ref 70–99)
POTASSIUM: 4.2 meq/L (ref 3.7–5.3)
SODIUM: 137 meq/L (ref 137–147)

## 2013-03-29 LAB — I-STAT TROPONIN, ED: TROPONIN I, POC: 0 ng/mL (ref 0.00–0.08)

## 2013-03-29 NOTE — ED Provider Notes (Signed)
CSN: [id number]     Arrival date & time 03/29/13  1210 History   First MD Initiated Contact with Patient 03/29/13 1311     Chief Complaint  Patient presents with  . Chest Pain     (Consider location/radiation/quality/duration/timing/severity/associated sxs/prior Treatment) The history is provided by medical records and the patient.   This is a 53 year old male with past medical history significant for hypertension, diabetes, hyperlipidemia, IBS, anxiety, presenting to the ED for intermittent, midsternal chest pain over the past several weeks, denies any currently. Patient states pain has actually been intermittent over the past 2 years, occurs independent of exertion and usually last < 3 seconds with each occurrence.  No associated SOB, palpitations, diaphoresis, or radiation of pain into arm and neck. Last episode occurred last night as pt was trying to sleep which caused him to be more anxious.  Patient is a fairly active male, he runs, cycles, and plays basketball weekly.  He has never been a smoker. He has no prior history of heart disease.  Patient sees cardiology annually for monitoring of his blood pressure, Dr. Rennis Golden, whom he saw last month and had dose of nadolol decreased to 10mg  daily due to bradycardia.  Patient has had prior nuclear stress test which was negative for acute findings.  Patient does admit that he flies frequently, usually on a bimonthly basis. Some trips long distance, mostly within the last. Most recent trip was in January to [place]. He states this trip made no change in his symptoms. He denies any lower extremity edema, calf pain, or shortness of breath.  No prior hx of PE or DVT.  Pt states he is leaving in 2 hours for a flight to [place], [place] but was nervous about getting on the plane without being evaluated first.  Think sx likely due to his IBS gas pains or anxiety but just wanted to "be checked out before he left".  Has been taking his xanax without significant  improvement of sx.  VS stable on arrival.  Past Medical History  Diagnosis Date  . Hyperlipidemia   . Diabetes mellitus without mention of complication   . Hypertension   . Internal hemorrhoids without mention of complication 01/02/2006  . IBS (irritable bowel syndrome)   . C. difficile colitis   . Rotator cuff tear    Past Surgical History  Procedure Laterality Date  . Tonsillectomy    . Tumor removal  2002    from neck  . Knee surgery Right   . Nm myocar perf wall motion  10/2009    bruce myoview; defect consistent with diaphragmatic attenuation; EF 54%, normal study   Family History  Problem Relation Age of Onset  . Heart disease Mother   . Diabetes Father   . Colon cancer Neg Hx   . Heart disease Brother 33   History  Substance Use Topics  . Smoking status: Never Smoker   . Smokeless tobacco: Current User    Types: Chew  . Alcohol Use: Yes    Review of Systems  Cardiovascular: Positive for chest pain.  All other systems reviewed and are negative.   Allergies  Review of patient's allergies indicates no known allergies.  Home Medications   Current Outpatient Rx  Name  Route  Sig  Dispense  Refill  . ALPRAZolam (XANAX) 0.5 MG tablet   Oral   Take 0.5 mg by mouth 3 (three) times daily as needed for anxiety.          Marland Kitchen  atorvastatin (LIPITOR) 40 MG tablet   Oral   Take 40 mg by mouth daily.           . Bismuth Subsalicylate (PEPTO-BISMOL PO)   Oral   Take by mouth as needed.         . hydrocortisone (ANUSOL-HC) 25 MG suppository      Use 1 suppositories at bedtime for 7 days and repeat as needed for hemorrhoidal bleeding.   7 suppository   2   . nadolol (CORGARD) 20 MG tablet   Oral   Take 10 mg by mouth daily.          . pioglitazone (ACTOS) 15 MG tablet   Oral   Take 15 mg by mouth daily.           . Simethicone (GAS-X PO)   Oral   Take by mouth as needed.         . valsartan-hydrochlorothiazide (DIOVAN-HCT) 160-12.5 MG per  tablet   Oral   Take 1 tablet by mouth daily.         . hyoscyamine (LEVSIN SL) 0.125 MG SL tablet   Oral   Take 1 tablet by mouth 3 (three) times daily as needed.          BP 156/80  Pulse 50  Temp(Src) 97.5 F (36.4 C) (Oral)  Resp 16  SpO2 99%  Physical Exam  Nursing note and vitals reviewed. Constitutional: He is oriented to person, place, and time. He appears well-developed and well-nourished. No distress.  HENT:  Head: Normocephalic and atraumatic.  Mouth/Throat: Oropharynx is clear and moist.  Eyes: Conjunctivae and EOM are normal. Pupils are equal, round, and reactive to light.  Neck: Normal range of motion. Neck supple.  Cardiovascular: Regular rhythm and normal heart sounds.  Bradycardia present.   Pulmonary/Chest: Effort normal and breath sounds normal. No respiratory distress. He has no wheezes.  Abdominal: Soft. Bowel sounds are normal. There is no tenderness. There is no guarding.  Musculoskeletal: Normal range of motion. He exhibits no edema.  No calf asymmetry, TTP, or palpable cords; negative Homan's sign bilaterally; strong DP pulses bilaterally  Neurological: He is alert and oriented to person, place, and time.  Skin: Skin is warm and dry. He is not diaphoretic.  Psychiatric: His mood appears anxious.  Pt anxious, asking multiple times about the results of his lab work and confirming that he is ok    ED Course  Procedures (including critical care time)   Date: 03/29/2013  Rate: 54  Rhythm: sinus bradycardia  QRS Axis: normal  Intervals: normal  ST/T Wave abnormalities: normal  Conduction Disutrbances:none  Narrative Interpretation:   Old EKG Reviewed: unchanged   Labs Review Labs Reviewed  BASIC METABOLIC PANEL - Abnormal; Notable for the following:    Glucose, Bld 121 (*)    All other components within normal limits  CBC  I-STAT TROPOININ, ED   Imaging Review Dg Chest 2 View  03/29/2013   CLINICAL DATA:  Chest pain  EXAM: CHEST  2  VIEW  COMPARISON:  Prior radiograph from 09/24/2009  FINDINGS: The cardiac and mediastinal silhouettes are stable in size and contour, and remain within normal limits.  The lungs are normally inflated. No airspace consolidation, pleural effusion, or pulmonary edema is identified. There is no pneumothorax.  No acute osseous abnormality identified.  IMPRESSION: No active cardiopulmonary disease.   Electronically Signed   By: Rise Mu M.D.   On: 03/29/2013 13:48  EKG Interpretation None      MDM   Final diagnoses:  Chest pain   EKG sinus bradycardia, no acute ischemic changes.  Trop negative.  Labs reassuring.  CXR clear.  Pt has remained asx while in the ED today.  His pain occurs on a daily basis, lasting < 3 seconds with each episode, occurs independently of exertion or travel circumstances.  Pt is PERC negative aside from age, no other known RF for PE besides travel.  Currently he is not tachycardic, not hypoxic, no signs of respiratory distress, and no prior hx of DVT or PE.  On exam, pt is anxious, asking multiple times about his work-up today and confirming that he is ok, similar to his prior ED visits with same complaint.  At this time, i have low suspicion for ACS, PE, dissection or other acute cardiac event.  Sx possibly due to IBS vs. anxiety or combination of both.  Pt is followed by cardiology annually with no known hx of CAD, negative nuclear stress test.  I had a long discussion with pt about his cardiac RF and need to seek emergent medical attention should sx change or worsen.  He acknowledged understanding and agreed with plan of care.    Garlon HatchetLisa M Nicolette Gieske, PA-C 03/29/13 1523

## 2013-03-29 NOTE — ED Provider Notes (Signed)
Medical screening examination/treatment/procedure(s) were performed by non-physician practitioner and as supervising physician I was immediately available for consultation/collaboration.   EKG Interpretation None        Layla MawKristen N Ward, DO 03/29/13 1532

## 2013-03-29 NOTE — Discharge Instructions (Signed)
Continue all home meds as directed. Follow up with your primary care physician. Return to the ED for new or worsening symptoms-- recurrent chest pain, shortness of breath, palpitations, dizziness, weakness, diaphoresis, etc.

## 2013-03-29 NOTE — ED Notes (Signed)
Pt c/o intermittent, central chest pain x "a couple weeks."  Currently, denies pain.  Pt sts "I think, it might be my nerves or IBS, but I just want to be sure.  I've seen my other doctors, but I'm still having this pain."

## 2013-05-24 ENCOUNTER — Encounter: Payer: Self-pay | Admitting: Internal Medicine

## 2013-05-24 ENCOUNTER — Ambulatory Visit (INDEPENDENT_AMBULATORY_CARE_PROVIDER_SITE_OTHER): Payer: 59 | Admitting: Internal Medicine

## 2013-05-24 VITALS — BP 130/88 | HR 64 | Ht 68.75 in | Wt 194.6 lb

## 2013-05-24 DIAGNOSIS — K625 Hemorrhage of anus and rectum: Secondary | ICD-10-CM

## 2013-05-24 DIAGNOSIS — K589 Irritable bowel syndrome without diarrhea: Secondary | ICD-10-CM

## 2013-05-24 NOTE — Progress Notes (Signed)
HISTORY OF PRESENT ILLNESS:  Bradley [name] is a 53 y.o. male with multiple medical problems as listed below. Followed in this office for GERD and IBS-type symptoms. He was evaluated 2 months ago with a chief complaint of minor rectal bleeding. Associated with that was minor rectal discomfort and fullness. He was treated with medicated suppositories with abrupt resolution of his symptoms. No recurrent bleeding for 2 months. Last colonoscopy in 2008 was normal. No family history of colon cancer. The chief complaint today is that of alternating bowel habits with a tendency toward constipation. No other complaints.  REVIEW OF SYSTEMS:  All non-GI ROS negative upon review  Past Medical History  Diagnosis Date  . Hyperlipidemia   . Diabetes mellitus without mention of complication   . Hypertension   . Internal hemorrhoids without mention of complication 01/02/2006  . IBS (irritable bowel syndrome)   . C. difficile colitis   . Rotator cuff tear   . Anxiety     Past Surgical History  Procedure Laterality Date  . Tonsillectomy    . Tumor removal  2002    from neck  . Knee surgery Right   . Nm myocar perf wall motion  10/2009    bruce myoview; defect consistent with diaphragmatic attenuation; EF 54%, normal study    Social History Bradley [name]  reports that he has never smoked. His smokeless tobacco use includes Chew. He reports that he drinks alcohol. He reports that he does not use illicit drugs.  family history includes Diabetes in his father; Heart disease in his mother; Heart disease (age of onset: 8047) in his brother. There is no history of Colon cancer.  No Known Allergies     PHYSICAL EXAMINATION: Vital signs: BP 130/88  Pulse 64  Ht 5' 8.75" (1.746 m)  Wt 194 lb 9.6 oz (88.27 kg)  BMI 28.96 kg/m2 General: Well-developed, well-nourished, no acute distress HEENT: Sclerae are anicteric, conjunctiva pink. Oral mucosa intact Lungs: Clear Heart: Regular Abdomen:  soft, nontender, nondistended, no obvious ascites, no peritoneal signs, normal bowel sounds. No organomegaly. Extremities: No edema Psychiatric: alert and oriented x3. Cooperative   ASSESSMENT:  #1. Transient minor rectal bleeding likely due to benign anorectal pathology. Resolved #2. Normal colonoscopy 2008 #3. IBS-type symptoms with a tendency toward constipation #4. Health related anxiety   PLAN:  #1. Metamucil one to 2 tablespoons daily #2. Routine repeat screening colonoscopy around 2018. Sooner if clinically indicated. Patient does contact the office for any relevant clinical GI issues

## 2013-05-24 NOTE — Patient Instructions (Signed)
Please follow up as needed 

## 2013-06-10 ENCOUNTER — Telehealth: Payer: Self-pay | Admitting: Internal Medicine

## 2013-06-10 NOTE — Telephone Encounter (Signed)
Pt states he had some diarrhea that was green. Discussed with pt that the foods we eat can change the color of the stool. Pt states he ate broccoli salad recently. Instructed pt to keep an eye on things and call us back if the diarrhea continued or got worse. He verbalized understanding.

## 2013-06-27 ENCOUNTER — Emergency Department (HOSPITAL_COMMUNITY)
Admission: EM | Admit: 2013-06-27 | Discharge: 2013-06-28 | Disposition: A | Discharge status: Home / Self Care | Payer: 59 | Attending: Emergency Medicine | Admitting: Emergency Medicine

## 2013-06-27 ENCOUNTER — Encounter (HOSPITAL_COMMUNITY): Payer: Self-pay | Admitting: Emergency Medicine

## 2013-06-27 DIAGNOSIS — Z79899 Other long term (current) drug therapy: Secondary | ICD-10-CM | POA: Insufficient documentation

## 2013-06-27 DIAGNOSIS — F419 Anxiety disorder, unspecified: Secondary | ICD-10-CM

## 2013-06-27 DIAGNOSIS — E119 Type 2 diabetes mellitus without complications: Secondary | ICD-10-CM | POA: Insufficient documentation

## 2013-06-27 DIAGNOSIS — Z8719 Personal history of other diseases of the digestive system: Secondary | ICD-10-CM | POA: Insufficient documentation

## 2013-06-27 DIAGNOSIS — I10 Essential (primary) hypertension: Secondary | ICD-10-CM | POA: Insufficient documentation

## 2013-06-27 DIAGNOSIS — F411 Generalized anxiety disorder: Secondary | ICD-10-CM | POA: Insufficient documentation

## 2013-06-27 DIAGNOSIS — Z8619 Personal history of other infectious and parasitic diseases: Secondary | ICD-10-CM | POA: Insufficient documentation

## 2013-06-27 DIAGNOSIS — IMO0001 Reserved for inherently not codable concepts without codable children: Secondary | ICD-10-CM | POA: Insufficient documentation

## 2013-06-27 DIAGNOSIS — E785 Hyperlipidemia, unspecified: Secondary | ICD-10-CM | POA: Insufficient documentation

## 2013-06-27 NOTE — ED Notes (Signed)
Pt presents with c/o anxiety. Pt went running last week at noon and started to feel really bad. Pt says that he thought he was dehydrated, started to have diarrhea. Pt called his doctor today and was told to go get some pedialyte and drink that. Pt says that all he has thought about since last Tuesday was that all of the dehydration and symptoms would kill him and his nerves have been very bad. Pt is calm at this time but says that he is nervous. Hx of anxiety.

## 2013-06-28 LAB — I-STAT CHEM 8, ED
BUN: 10 mg/dL (ref 6–23)
Calcium, Ion: 1.23 mmol/L (ref 1.12–1.23)
Chloride: 99 mEq/L (ref 96–112)
Creatinine, Ser: 0.8 mg/dL (ref 0.50–1.35)
Glucose, Bld: 115 mg/dL — ABNORMAL HIGH (ref 70–99)
HEMATOCRIT: 48 % (ref 39.0–52.0)
HEMOGLOBIN: 16.3 g/dL (ref 13.0–17.0)
Potassium: 3.6 mEq/L — ABNORMAL LOW (ref 3.7–5.3)
SODIUM: 140 meq/L (ref 137–147)
TCO2: 25 mmol/L (ref 0–100)

## 2013-06-28 NOTE — ED Notes (Signed)
Pt reports he went for a run yesterday, ran for 3 miles.  When he took his dog out, his legs started to feel weak.  Pt reports hx of anxiety, has not taken his med.

## 2013-06-28 NOTE — ED Provider Notes (Signed)
Medical screening examination/treatment/procedure(s) were performed by non-physician practitioner and as supervising physician I was immediately available for consultation/collaboration.   EKG Interpretation None       April K Palumbo-Rasch, MD 06/28/13 0248 

## 2013-06-28 NOTE — ED Provider Notes (Signed)
CSN: [id number]     Arrival date & time 06/27/13  2238 History   First MD Initiated Contact with Patient 06/28/13 0130     Chief Complaint  Patient presents with  . Anxiety     (Consider location/radiation/quality/duration/timing/severity/associated sxs/prior Treatment) HPI Comments: Patient with a history of anxiety presents today with anxiety.  He, states he got his medication in the mail today, but didn't take any.  His anxiety started last Monday when he went for his normal one in the heat and he felt very dehydrated.  He has been unable to get past the fact that dehydration can "kill him."  He has been drinking Gatorade.  He is contacted his primary care physician, who told him to drink Gatorade.  He has been running since that time without any difficulty, but tonight after his run and taking his dog for a walk.  His legs felt heavy drinking for evaluation.  On arrival to the emergency department.  He, feels back at his baseline.  Patient is a 53 y.o. male presenting with anxiety. The history is provided by the patient.  Anxiety This is a new problem. The current episode started in the past 7 days. The problem occurs constantly. The problem has been unchanged. Associated symptoms include myalgias. Pertinent negatives include no arthralgias, fever, numbness, rash or weakness. Nothing aggravates the symptoms. He has tried nothing for the symptoms. The treatment provided no relief.    Past Medical History  Diagnosis Date  . Hyperlipidemia   . Diabetes mellitus without mention of complication   . Hypertension   . Internal hemorrhoids without mention of complication 01/02/2006  . IBS (irritable bowel syndrome)   . C. difficile colitis   . Rotator cuff tear   . Anxiety    Past Surgical History  Procedure Laterality Date  . Tonsillectomy    . Tumor removal  2002    from neck  . Knee surgery Right   . Nm myocar perf wall motion  10/2009    bruce myoview; defect consistent with  diaphragmatic attenuation; EF 54%, normal study   Family History  Problem Relation Age of Onset  . Heart disease Mother   . Diabetes Father   . Colon cancer Neg Hx   . Heart disease Brother 7047   History  Substance Use Topics  . Smoking status: Never Smoker   . Smokeless tobacco: Current User    Types: Chew  . Alcohol Use: Yes    Review of Systems  Constitutional: Negative for fever.  Genitourinary: Negative for dysuria and hematuria.  Musculoskeletal: Positive for myalgias. Negative for arthralgias.  Skin: Negative for rash and wound.  Neurological: Negative for dizziness, weakness and numbness.  All other systems reviewed and are negative.     Allergies  Review of patient's allergies indicates no known allergies.  Home Medications   Prior to Admission medications   Medication Sig Start Date End Date Taking? Authorizing Provider  ALPRAZolam Prudy Feeler(XANAX) 0.5 MG tablet Take 0.5 mg by mouth 3 (three) times daily as needed for anxiety.    Yes Historical Provider, MD  atorvastatin (LIPITOR) 40 MG tablet Take 40 mg by mouth daily.     Yes Historical Provider, MD  metoCLOPramide (REGLAN) 5 MG tablet Take 5 mg by mouth daily.  05/02/13  Yes Historical Provider, MD  nadolol (CORGARD) 20 MG tablet Take 10 mg by mouth daily.    Yes Historical Provider, MD  pioglitazone (ACTOS) 15 MG tablet Take 15 mg by mouth  daily.     Yes Historical Provider, MD  Simethicone (GAS-X PO) Take by mouth as needed.   Yes Historical Provider, MD  valsartan-hydrochlorothiazide (DIOVAN-HCT) 160-12.5 MG per tablet Take 1 tablet by mouth daily.   Yes Historical Provider, MD   BP 169/93  Pulse 92  Temp(Src) 98.5 F (36.9 C) (Oral)  Resp 18  Ht 5\' 10"  (1.778 m)  Wt 189 lb (85.73 kg)  BMI 27.12 kg/m2  SpO2 99% Physical Exam  Vitals reviewed. Constitutional: He is oriented to person, place, and time. He appears well-developed and well-nourished.  HENT:  Head: Normocephalic.  Eyes: Pupils are equal, round,  and reactive to light.  Neck: Normal range of motion.  Cardiovascular: Normal rate.   Pulmonary/Chest: Effort normal.  Abdominal: Soft.  Musculoskeletal: Normal range of motion.  Neurological: He is alert and oriented to person, place, and time.  Skin: Skin is warm.  Psychiatric: His mood appears anxious.    ED Course  Procedures (including critical care time) Labs Review Labs Reviewed  I-STAT CHEM 8, ED - Abnormal; Notable for the following:    Potassium 3.6 (*)    Glucose, Bld 115 (*)    All other components within normal limits    Imaging Review No results found.   EKG Interpretation None      MDM  Electrolytes are normal  Final diagnoses:  Anxiety         Arman FilterGail K Schulz, NP 06/28/13 [phone]

## 2013-06-28 NOTE — Discharge Instructions (Signed)
Panic Attacks °Panic attacks are sudden, short-lived surges of severe anxiety, fear, or discomfort. They may occur for no reason when you are relaxed, when you are anxious, or when you are sleeping. Panic attacks may occur for a number of reasons:  °· Healthy people occasionally have panic attacks in extreme, life-threatening situations, such as war or natural disasters. Normal anxiety is a protective mechanism of the body that helps us react to danger (fight or flight response). °· Panic attacks are often seen with anxiety disorders, such as panic disorder, social anxiety disorder, generalized anxiety disorder, and phobias. Anxiety disorders cause excessive or uncontrollable anxiety. They may interfere with your relationships or other life activities. °· Panic attacks are sometimes seen with other mental illnesses, such as depression and posttraumatic stress disorder. °· Certain medical conditions, prescription medicines, and drugs of abuse can cause panic attacks. °SYMPTOMS  °Panic attacks start suddenly, peak within 20 minutes, and are accompanied by four or more of the following symptoms: °· Pounding heart or fast heart rate (palpitations). °· Sweating. °· Trembling or shaking. °· Shortness of breath or feeling smothered. °· Feeling choked. °· Chest pain or discomfort. °· Nausea or strange feeling in your stomach. °· Dizziness, light-headedness, or feeling like you will faint. °· Chills or hot flushes. °· Numbness or tingling in your lips or hands and feet. °· Feeling that things are not real or feeling that you are not yourself. °· Fear of losing control or going crazy. °· Fear of dying. °Some of these symptoms can mimic serious medical conditions. For example, you may think you are having a heart attack. Although panic attacks can be very scary, they are not life threatening. °DIAGNOSIS  °Panic attacks are diagnosed through an assessment by your health care provider. Your health care provider will ask  questions about your symptoms, such as where and when they occurred. Your health care provider will also ask about your medical history and use of alcohol and drugs, including prescription medicines. Your health care provider may order blood tests or other studies to rule out a serious medical condition. Your health care provider may refer you to a mental health professional for further evaluation. °TREATMENT  °· Most healthy people who have one or two panic attacks in an extreme, life-threatening situation will not require treatment. °· The treatment for panic attacks associated with anxiety disorders or other mental illness typically involves counseling with a mental health professional, medicine, or a combination of both. Your health care provider will help determine what treatment is best for you. °· Panic attacks due to physical illness usually go away with treatment of the illness. If prescription medicine is causing panic attacks, talk with your health care provider about stopping the medicine, decreasing the dose, or substituting another medicine. °· Panic attacks due to alcohol or drug abuse go away with abstinence. Some adults need professional help in order to stop drinking or using drugs. °HOME CARE INSTRUCTIONS  °· Take all medicines as directed by your health care provider.   °· Schedule and attend follow-up visits as directed by your health care provider. It is important to keep all your appointments. °SEEK MEDICAL CARE IF: °· You are not able to take your medicines as prescribed. °· Your symptoms do not improve or get worse. °SEEK IMMEDIATE MEDICAL CARE IF:  °· You experience panic attack symptoms that are different than your usual symptoms. °· You have serious thoughts about hurting yourself or others. °· You are taking medicine for panic attacks and   have a serious side effect. MAKE SURE YOU:  Understand these instructions.  Will watch your condition.  Will get help right away if you are not  doing well or get worse. Document Released: 12/23/2004 Document Revised: 12/28/2012 Document Reviewed: 08/06/2012 [facility] Patient Information 2015 [place] [place]. This information is not intended to replace advice given to you by your health care provider. Make sure you discuss any questions you have with your health care provider. Tonight your electrolytes are normal your are not dehydrated  Take your anxiety medication when you get home and as needed

## 2013-08-03 ENCOUNTER — Telehealth: Payer: Self-pay | Admitting: Internal Medicine

## 2013-08-03 NOTE — Telephone Encounter (Signed)
[facility]  [facility] on 08[facility]09/13.

## 2013-08-09 ENCOUNTER — Ambulatory Visit (INDEPENDENT_AMBULATORY_CARE_PROVIDER_SITE_OTHER): Payer: 59 | Admitting: Internal Medicine

## 2013-08-09 ENCOUNTER — Telehealth (HOSPITAL_COMMUNITY): Payer: Self-pay

## 2013-08-09 ENCOUNTER — Encounter: Payer: Self-pay | Admitting: Internal Medicine

## 2013-08-09 VITALS — BP 160/86 | HR 87 | Ht 69.0 in | Wt 192.5 lb

## 2013-08-09 DIAGNOSIS — I10 Essential (primary) hypertension: Secondary | ICD-10-CM

## 2013-08-09 DIAGNOSIS — E785 Hyperlipidemia, unspecified: Secondary | ICD-10-CM

## 2013-08-09 DIAGNOSIS — R0789 Other chest pain: Secondary | ICD-10-CM

## 2013-08-09 DIAGNOSIS — R079 Chest pain, unspecified: Secondary | ICD-10-CM

## 2013-08-09 DIAGNOSIS — E119 Type 2 diabetes mellitus without complications: Secondary | ICD-10-CM

## 2013-08-09 DIAGNOSIS — E139 Other specified diabetes mellitus without complications: Secondary | ICD-10-CM

## 2013-08-09 DIAGNOSIS — R9431 Abnormal electrocardiogram [ECG] [EKG]: Secondary | ICD-10-CM | POA: Insufficient documentation

## 2013-08-09 NOTE — Patient Instructions (Signed)
Your physician has requested that you have en exercise stress myoview. For further information please visit [email or web address]. Please follow instruction sheet, as given.  Your physician recommends that you schedule a follow-up appointment after your stress test.   

## 2013-08-09 NOTE — Progress Notes (Signed)
OFFICE NOTE  Chief Complaint:  IBS complaints  Primary Care Physician: Bradley Fillers, MD  HPI:  Bradley [name] is a 53 year old gentleman with a history of hypertension and non-anginal chest pain. He had a nuclear stress test, which was negative, and has continued to feel well. He was ultimately diagnosed with IBS and has various complaints related to this. He continues to exercise, runs about a total of 10 miles a week, and is asymptomatic. No shortness of breath, palpitations, presyncope, or syncopal symptoms. His main issue is ongoing anxiety which she reports as significant. He is been in the hospital apparently a couple times in the emergency room with various complaints of chest discomfort and abdominal pain and ultimately was sent home with increasing doses of Xanax. He reports his Xanax does not particularly help him. He's never tried any SSRI or SNRI medication for his anxiety which does interfere with his lifestyle.  He has never sought counseling or psychiatric help.  Finally today, he is noted to have a marked sinus bradycardia with a rate in the 40s.  I've noted that he decreased his nadolol to 10 mg twice daily as he was previously only taking 10 mg (one half tablet) daily.  Bradley [name] returns today for followup. He tells me that he's been in the hospital 5 times over the past year for chest pain. He said continued workup and ruled out for MI. Has not had any evaluation of his heart was told that was at times his symptoms were not cardiac. His last stress test was in 2011 and was negative for ischemia. He was working however is now retired. He reports he is doing less running and activity however still plays basketball. His symptoms are not necessarily associated with exertion or relieved by rest. Is a diabetic. His EKG today however is abnormal demonstrating inferior T-wave inversions in 23 and aVF and mild ST segment depression laterally.  PMHx:  Past Medical History    Diagnosis Date  . Hyperlipidemia   . Diabetes mellitus without mention of complication   . Hypertension   . Internal hemorrhoids without mention of complication 01/02/2006  . IBS (irritable bowel syndrome)   . [name]. difficile colitis   . Rotator cuff tear   . Anxiety     Past Surgical History  Procedure Laterality Date  . Tonsillectomy    . Tumor removal  2002    from neck  . Knee surgery Right   . Nm myocar perf wall motion  10/2009    bruce myoview; defect consistent with diaphragmatic attenuation; EF 54%, normal study    FAMHx:  Family History  Problem Relation Age of Onset  . Heart disease Mother   . Diabetes Father   . Colon cancer Neg Hx   . Heart disease Brother 9    SOCHx:   reports that he has never smoked. His smokeless tobacco use includes Chew. He reports that he drinks alcohol. He reports that he does not use illicit drugs.  ALLERGIES:  No Known Allergies  ROS: A comprehensive review of systems was negative except for: Cardiovascular: positive for chest pain Behavioral/Psych: positive for anxiety  HOME MEDS: Current Outpatient Prescriptions  Medication Sig Dispense Refill  . ALPRAZolam (XANAX) 1 MG tablet Take by mouth 3 (three) times daily as needed for anxiety.      Marland Kitchen atorvastatin (LIPITOR) 40 MG tablet Take 40 mg by mouth daily.        Marland Kitchen azithromycin (ZITHROMAX) 500 MG  tablet as directed.      . metoCLOPramide (REGLAN) 5 MG tablet Take 5 mg by mouth daily.       . metoprolol succinate (TOPROL-XL) 25 MG 24 hr tablet Take 1 tablet by mouth daily.      . pioglitazone (ACTOS) 15 MG tablet Take 15 mg by mouth daily.        . sertraline (ZOLOFT) 25 MG tablet Take 1 tablet by mouth daily.      . Simethicone (GAS-X PO) Take by mouth as needed.      . valsartan-hydrochlorothiazide (DIOVAN-HCT) 160-12.5 MG per tablet Take 1 tablet by mouth daily.       No current facility-administered medications for this visit.    LABS/IMAGING: No results found for this  or any previous visit (from the past 48 hour(s)). No results found.  VITALS: BP 160/86  Pulse 87  Ht 5\' 9"  (1.753 m)  Wt 192 lb 8 oz (87.317 kg)  BMI 28.41 kg/m2  EXAM: General appearance: alert and mildly anxious Neck: no carotid bruit and no JVD Lungs: clear to auscultation bilaterally Heart: regular rate and rhythm, S1, S2 normal, no murmur, click, rub or gallop Abdomen: soft, non-tender; bowel sounds normal; no masses,  no organomegaly Extremities: extremities normal, atraumatic, no cyanosis or edema Pulses: 2+ and symmetric Skin: Skin color, texture, turgor normal. No rashes or lesions Neurologic: Grossly normal Psych: Anxious  EKG: Normal sinus rhythm at 87, RSR in V1, ST and T wave changes inferiorly and laterally  ASSESSMENT: 1. Recurrent chest pain 2. Anxiety 3. Hypertension-controlled 4. Dyslipidemia - TC 147 5. DM2- A1C 5.7  PLAN: 1.   Bradley [name] is now having recurrent chest pain episodes. Is been in the hospital 5 times over the past year and sent home. Sometimes his symptoms are exertionally related however oftentimes air at rest. His last stress test was in 2011 was negative and he is a diabetic. His A1c however is well-controlled 5.7. His total cholesterol was 147 indicating good control. His EKG is abnormal however showing inferior and lateral T-wave changes and mild ST depression. His EKG could suggest the beginning of a right ventricular conduction delay. I would recommend a repeat stress test, particularly an exercise nuclear stress test.  I plan to see him back to discuss the results of those findings.  Bradley NoseKenneth [name]. Stepheny Canal, MD, [facility] HospitalFACC Attending Cardiologist [facility]  Bradley [name] 08/09/2013, 9:46 AM

## 2013-08-09 NOTE — Telephone Encounter (Signed)
Encounter complete. 

## 2013-08-11 ENCOUNTER — Ambulatory Visit (HOSPITAL_COMMUNITY)
Admission: RE | Admit: 2013-08-11 | Discharge: 2013-08-11 | Disposition: A | Discharge status: Home / Self Care | Payer: 59 | Source: Ambulatory Visit | Attending: Cardiovascular Disease | Admitting: Cardiovascular Disease

## 2013-08-11 DIAGNOSIS — E139 Other specified diabetes mellitus without complications: Secondary | ICD-10-CM

## 2013-08-11 DIAGNOSIS — R079 Chest pain, unspecified: Secondary | ICD-10-CM | POA: Insufficient documentation

## 2013-08-11 DIAGNOSIS — I10 Essential (primary) hypertension: Secondary | ICD-10-CM

## 2013-08-11 DIAGNOSIS — R9431 Abnormal electrocardiogram [ECG] [EKG]: Secondary | ICD-10-CM | POA: Insufficient documentation

## 2013-08-11 DIAGNOSIS — E119 Type 2 diabetes mellitus without complications: Secondary | ICD-10-CM | POA: Diagnosis not present

## 2013-08-11 MED ORDER — TECHNETIUM TC 99M SESTAMIBI GENERIC - CARDIOLITE
10.2000 | Freq: Once | INTRAVENOUS | Status: AC | PRN
  Administered 2013-08-11: 10 via INTRAVENOUS

## 2013-08-11 MED ORDER — TECHNETIUM TC 99M SESTAMIBI GENERIC - CARDIOLITE
30.4000 | Freq: Once | INTRAVENOUS | Status: AC | PRN
  Administered 2013-08-11: 30 via INTRAVENOUS

## 2013-08-11 NOTE — Op Note (Signed)
Pt came in for his Stress NUC MPI. I did start pt's IV. After starting IV pt did have a syncopal episode. I did call out for assistance while holding pt upright in chair. We did recline pt in the recliner and a mild sternum rub did bring the pt around. Pt was cool and diaphoretic. Pt is a diabetic and stated he had not eaten since noon yesterday. Pt glucose was 111 mg/dl. Pt BP and heart rate as follows: @0740 --110/78 & HR of 76 @0750 --88/52 & HR of 75 @0810 --142/78 & HR of 60 At 0750 pt was still diaphoretic and cool. Pt was started on a 1,000 ml bolus of NS. Pt was given juice 8 oz and 8 oz water. Pt was given crackers and peanut butter. Dr. Royann Shiversroitoru at pt side for evaluation. Per MD Croitoru, Pt will continue to receive fluid bolus and will continue with stress test as ordered. At this time pt is resting in recliner. Pt color is good. Pt is talking and states he "feels much better". I will continue to monitor pt and Md Croitoru has planned to be at pt side for stress test.

## 2013-08-11 NOTE — Procedures (Addendum)
[name] [address] [place] [address] [phone]  Cardiology Nuclear Med Study  Bradley ChesterfieldMichael B [name] is a 53 y.o. male     MRN : [id number]     DOB: 1960-07-24  Procedure Date: 08/11/2013  Nuclear Med Background Indication for Stress Test:  Evaluation for Ischemia, Post Hospital and Abnormal EKG History:  Last NUC MPI on 10/17/2009-nonischemic;EF=54% Cardiac Risk Factors: Family History - CAD, Hypertension, Lipids, NIDDM and Overweight  Symptoms:  Chest Pain   Nuclear Pre-Procedure Caffeine/Decaff Intake:  7:00pm NPO After: 5:00am   IV Site: R Forearm  IV 0.9% NS with Angio Cath:  22g  Chest Size (in):  48"  IV Started by: Berdie OgrenAmanda Wease, RN  Height: 5\' 9"  (1.753 m)  Cup Size: n/a  BMI:  Body mass index is 28.34 kg/(m^2). Weight:  192 lb (87.091 kg)   Tech Comments:  n/a    Nuclear Med Study 1 or 2 day study: 1 day  Stress Test Type:  Stress  Order Authorizing Provider:  Zoila ShutterKenneth Hilty, MD   Resting Radionuclide: Technetium 2726m Sestamibi  Resting Radionuclide Dose: 10.2 mCi   Stress Radionuclide:  Technetium 1026m Sestamibi  Stress Radionuclide Dose: 30.4 mCi           Stress Protocol Rest HR:107 Stress HR: 157  Rest BP: 157/81 Stress BP: 159/78  Exercise Time (min): 7 METS: 8.5   Predicted Max HR: 166 bpm % Max HR: 94.58 bpm Rate Pressure Product: [id number]  Dose of Adenosine (mg):  n/a Dose of Lexiscan: n/a mg  Dose of Atropine (mg): n/a Dose of Dobutamine: n/a mcg/kg/min (at max HR)  Stress Test Technologist: Esperanza Sheetserry-Marie Martin, CCT Nuclear Technologist: Gonzella LexPam Phillips, CNMT   Rest Procedure:  Myocardial perfusion imaging was performed at rest 45 minutes following the intravenous administration of Technetium 1026m Sestamibi. Stress Procedure:  The patient performed treadmill exercise using a Bruce  Protocol for 7 minutes. The patient stopped due to SOB and denied any chest pain.  There were no significant ST-T  wave changes.  Technetium 926m Sestamibi was injected IV at peak exercise and myocardial perfusion imaging was performed after a brief delay.  Transient Ischemic Dilatation (Normal <1.22):  1.09  QGS EDV:  113 ml QGS ESV:  45 ml LV Ejection Fraction: 60%     Rest ECG: NSR - Normal EKG and NSR with non-specific ST-T wave changes  Stress ECG: No significant ST segment change suggestive of ischemia.  QPS Raw Data Images:  Normal; no motion artifact; normal heart/lung ratio. Stress Images:  Normal homogeneous uptake in all areas of the myocardium. Rest Images:  Normal homogeneous uptake in all areas of the myocardium. Subtraction (SDS):  No evidence of ischemia. LV Wall Motion:  NL LV Function; NL Wall Motion  Impression Exercise Capacity:  Fair exercise capacity. BP Response:  Normal blood pressure response. Clinical Symptoms:  No significant symptoms noted. ECG Impression:  No significant ST segment change suggestive of ischemia. Comparison with Prior Nuclear Study: No significant change from previous study   Overall Impression:  Normal stress nuclear study.  Note brief vasovagal syncope before test, after insertion of intravenous cannula. Rapid and complete recovery.   Bradley [name],Bradley Shannon, MD  08/11/2013 1:03 PM

## 2013-08-17 ENCOUNTER — Ambulatory Visit: Payer: 59 | Admitting: Internal Medicine

## 2015-09-14 ENCOUNTER — Other Ambulatory Visit: Payer: Self-pay | Admitting: Neurosurgery

## 2015-09-14 DIAGNOSIS — M5136 Other intervertebral disc degeneration, lumbar region: Secondary | ICD-10-CM

## 2015-09-25 ENCOUNTER — Ambulatory Visit
Admission: RE | Admit: 2015-09-25 | Discharge: 2015-09-25 | Disposition: A | Discharge status: Home / Self Care | Payer: Managed Care, Other (non HMO) | Source: Ambulatory Visit | Attending: Neurosurgery | Admitting: Neurosurgery

## 2015-09-25 DIAGNOSIS — M5136 Other intervertebral disc degeneration, lumbar region: Secondary | ICD-10-CM

## 2015-09-25 IMAGING — XA Imaging study
2 series · 2 of 2 positions shown · non-contrast
Comparison: none

CLINICAL DATA: Lumbosacral spondylosis without myelopathy. Low back
pain and left lower extremity pain. Annular fissures at L3-4 and
L4-5 on MRI. L3-4 epidural injection requested.

[Series 1: ortho standard · 1 of 1 slices shown (1 of 2)]
[im 1/1]
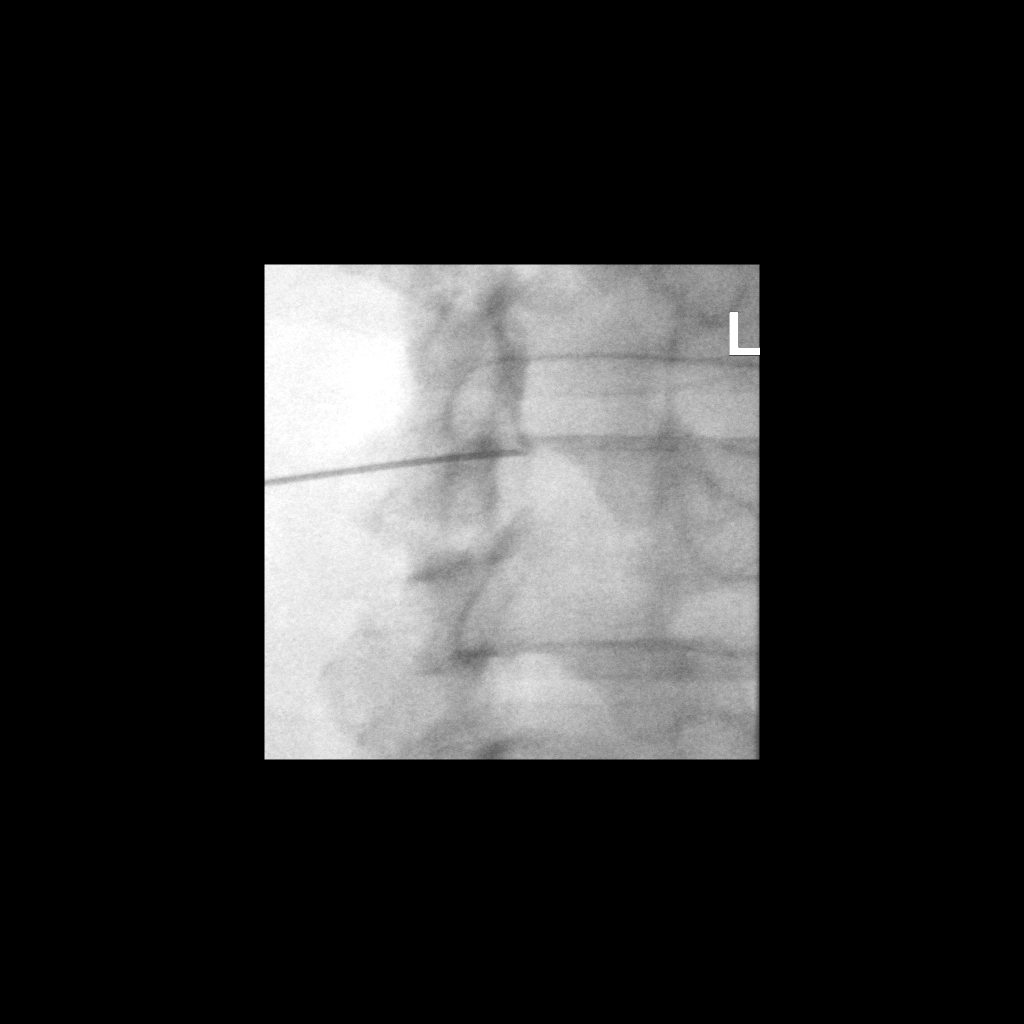

[Series 2: ortho standard · 1 of 1 slices shown (2 of 2)]
[im 1/1]
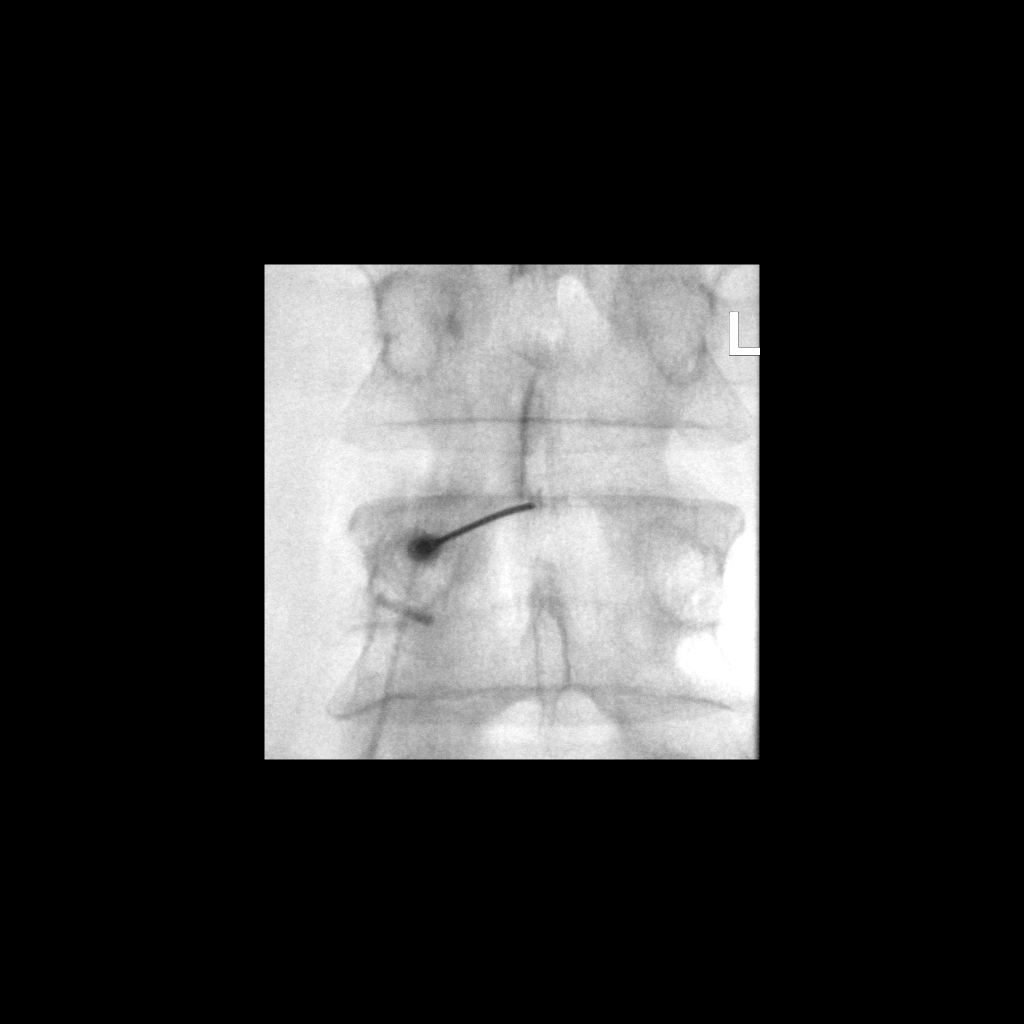

[2 of 2 positions shown; findings below may reference images not displayed]

FLUOROSCOPY TIME:  Radiation Exposure Index (as provided by the
fluoroscopic device): 23.87 microGray*m^2

Fluoroscopy Time (in minutes and seconds):  6 seconds

PROCEDURE:
The procedure, risks, benefits, and alternatives were explained to
the patient. Questions regarding the procedure were encouraged and
answered. The patient understands and consents to the procedure.

LUMBAR EPIDURAL INJECTION:

An interlaminar approach was performed on the left at L3-4. The
overlying skin was cleansed and anesthetized. A 3.5 inch 20 gauge
epidural needle was advanced using loss-of-resistance technique.

DIAGNOSTIC EPIDURAL INJECTION:

Injection of Isovue-M 200 shows a good epidural pattern with spread
above and below the level of needle placement, primarily on the
left. No vascular opacification is seen.

THERAPEUTIC EPIDURAL INJECTION:

120 mg of Depo-Medrol mixed with 3 mL of 1% lidocaine were
instilled. The procedure was well-tolerated, and the patient was
discharged thirty minutes following the injection in good condition.

COMPLICATIONS:
None
IMPRESSION: Technically successful lumbar interlaminar epidural injection on the
left at L3-4.

## 2015-09-25 MED ORDER — METHYLPREDNISOLONE ACETATE 40 MG/ML INJ SUSP (RADIOLOG
120.0000 mg | Freq: Once | INTRAMUSCULAR | Status: AC
  Administered 2015-09-25: 120 mg via EPIDURAL

## 2015-09-25 MED ORDER — IOPAMIDOL (ISOVUE-M 200) INJECTION 41%
1.0000 mL | Freq: Once | INTRAMUSCULAR | Status: AC
  Administered 2015-09-25: 1 mL via EPIDURAL

## 2015-09-25 NOTE — Discharge Instructions (Signed)

## 2016-03-04 ENCOUNTER — Encounter: Payer: Self-pay | Admitting: Internal Medicine

## 2016-04-25 ENCOUNTER — Other Ambulatory Visit: Payer: Self-pay | Admitting: Physical Medicine and Rehabilitation

## 2016-04-25 DIAGNOSIS — M47816 Spondylosis without myelopathy or radiculopathy, lumbar region: Secondary | ICD-10-CM

## 2016-04-29 ENCOUNTER — Ambulatory Visit
Admission: RE | Admit: 2016-04-29 | Discharge: 2016-04-29 | Disposition: A | Discharge status: Home / Self Care | Payer: Managed Care, Other (non HMO) | Source: Ambulatory Visit | Attending: Physical Medicine and Rehabilitation | Admitting: Physical Medicine and Rehabilitation

## 2016-04-29 ENCOUNTER — Other Ambulatory Visit: Payer: Self-pay | Admitting: Physical Medicine and Rehabilitation

## 2016-04-29 DIAGNOSIS — M47816 Spondylosis without myelopathy or radiculopathy, lumbar region: Secondary | ICD-10-CM

## 2016-04-29 MED ORDER — METHYLPREDNISOLONE ACETATE 40 MG/ML INJ SUSP (RADIOLOG
120.0000 mg | Freq: Once | INTRAMUSCULAR | Status: AC
  Administered 2016-04-29: 120 mg via INTRA_ARTICULAR

## 2016-04-29 NOTE — Discharge Instructions (Signed)

## 2016-07-24 DIAGNOSIS — M47816 Spondylosis without myelopathy or radiculopathy, lumbar region: Secondary | ICD-10-CM | POA: Diagnosis not present

## 2016-07-24 DIAGNOSIS — S134XXS Sprain of ligaments of cervical spine, sequela: Secondary | ICD-10-CM | POA: Diagnosis not present

## 2016-08-22 DIAGNOSIS — I10 Essential (primary) hypertension: Secondary | ICD-10-CM | POA: Diagnosis not present

## 2016-08-22 DIAGNOSIS — E1129 Type 2 diabetes mellitus with other diabetic kidney complication: Secondary | ICD-10-CM | POA: Diagnosis not present

## 2016-08-22 DIAGNOSIS — Z125 Encounter for screening for malignant neoplasm of prostate: Secondary | ICD-10-CM | POA: Diagnosis not present

## 2016-08-27 DIAGNOSIS — E1129 Type 2 diabetes mellitus with other diabetic kidney complication: Secondary | ICD-10-CM | POA: Diagnosis not present

## 2016-08-27 DIAGNOSIS — Z0000 Encounter for general adult medical examination without abnormal findings: Secondary | ICD-10-CM | POA: Diagnosis not present

## 2016-08-27 DIAGNOSIS — Z1389 Encounter for screening for other disorder: Secondary | ICD-10-CM | POA: Diagnosis not present

## 2016-08-27 DIAGNOSIS — M545 Low back pain: Secondary | ICD-10-CM | POA: Diagnosis not present

## 2016-10-08 DIAGNOSIS — Z23 Encounter for immunization: Secondary | ICD-10-CM | POA: Diagnosis not present

## 2017-04-27 DIAGNOSIS — J302 Other seasonal allergic rhinitis: Secondary | ICD-10-CM | POA: Diagnosis not present

## 2017-04-27 DIAGNOSIS — I10 Essential (primary) hypertension: Secondary | ICD-10-CM | POA: Diagnosis not present

## 2017-08-13 DIAGNOSIS — E119 Type 2 diabetes mellitus without complications: Secondary | ICD-10-CM | POA: Diagnosis not present

## 2017-10-15 DIAGNOSIS — Z0000 Encounter for general adult medical examination without abnormal findings: Secondary | ICD-10-CM | POA: Diagnosis not present

## 2017-10-15 DIAGNOSIS — R82998 Other abnormal findings in urine: Secondary | ICD-10-CM | POA: Diagnosis not present

## 2017-10-22 DIAGNOSIS — E1129 Type 2 diabetes mellitus with other diabetic kidney complication: Secondary | ICD-10-CM | POA: Diagnosis not present

## 2017-10-22 DIAGNOSIS — Z1389 Encounter for screening for other disorder: Secondary | ICD-10-CM | POA: Diagnosis not present

## 2017-10-22 DIAGNOSIS — Z23 Encounter for immunization: Secondary | ICD-10-CM | POA: Diagnosis not present

## 2017-10-22 DIAGNOSIS — Z0000 Encounter for general adult medical examination without abnormal findings: Secondary | ICD-10-CM | POA: Diagnosis not present

## 2017-10-22 DIAGNOSIS — R0600 Dyspnea, unspecified: Secondary | ICD-10-CM | POA: Diagnosis not present

## 2018-11-17 DIAGNOSIS — Z79899 Other long term (current) drug therapy: Secondary | ICD-10-CM | POA: Diagnosis not present

## 2018-11-17 DIAGNOSIS — R351 Nocturia: Secondary | ICD-10-CM | POA: Diagnosis not present

## 2018-11-17 DIAGNOSIS — E7849 Other hyperlipidemia: Secondary | ICD-10-CM | POA: Diagnosis not present

## 2018-11-22 DIAGNOSIS — E7849 Other hyperlipidemia: Secondary | ICD-10-CM | POA: Diagnosis not present

## 2018-11-22 DIAGNOSIS — R351 Nocturia: Secondary | ICD-10-CM | POA: Diagnosis not present

## 2018-11-22 DIAGNOSIS — Z1389 Encounter for screening for other disorder: Secondary | ICD-10-CM | POA: Diagnosis not present

## 2018-11-22 DIAGNOSIS — M545 Low back pain: Secondary | ICD-10-CM | POA: Diagnosis not present

## 2018-11-22 DIAGNOSIS — I10 Essential (primary) hypertension: Secondary | ICD-10-CM | POA: Diagnosis not present

## 2018-12-19 DIAGNOSIS — Z03818 Encounter for observation for suspected exposure to other biological agents ruled out: Secondary | ICD-10-CM | POA: Diagnosis not present

## 2018-12-19 DIAGNOSIS — Z20828 Contact with and (suspected) exposure to other viral communicable diseases: Secondary | ICD-10-CM | POA: Diagnosis not present

## 2022-07-07 LAB — LAB REPORT - SCANNED
EGFR (Non-African Amer.): 98
TSH: 1.81 (ref 0.41–5.90)

## 2023-08-03 LAB — LAB REPORT - SCANNED: EGFR (Non-African Amer.): 97.6

## 2023-09-14 LAB — LAB REPORT - SCANNED: EGFR (Non-African Amer.): 97.6

## 2023-10-30 ENCOUNTER — Encounter

## 2023-11-11 ENCOUNTER — Encounter: Admitting: Internal Medicine

## 2023-11-20 ENCOUNTER — Ambulatory Visit (AMBULATORY_SURGERY_CENTER)

## 2023-11-20 VITALS — Ht 70.0 in | Wt 212.0 lb

## 2023-11-20 DIAGNOSIS — Z1211 Encounter for screening for malignant neoplasm of colon: Secondary | ICD-10-CM

## 2023-11-20 MED ORDER — NA SULFATE-K SULFATE-MG SULF 17.5-3.13-1.6 GM/177ML PO SOLN: 1.0000 | Freq: Once | ORAL | 0 refills | Status: AC

## 2023-11-20 NOTE — Progress Notes (Signed)
 PCP MD at time of PV: Toribio Shan, MD  __________________________________________________________________________________________________________________________________________  No egg allergy known to patient  No soy allergy known to patient No issues known to pt with past sedation with any surgeries or procedures Patient denies ever being told they had issues or difficulty with intubation  No FH of Malignant Hyperthermia Pt is not on diet pills Pt is not on  home 02  Pt is not on blood thinners  No A fib or A flutter Have any cardiac testing pending--no  LOA: independent PT advised to hold nicotine patches midnight the day of the procedure  __________________________________________________________________________________________________________________________________________  Constipation: drug induced with ozempic  Prep: suprep  __________________________________________________________________________________________________________________________________________  PV completed with patient. Prep instructions reviewed and provided during apt. Rx sent to preferred pharmacy.  __________________________________________________________________________________________________________________________________________  Patient's chart reviewed by Norleen Schillings CNRA prior to previsit and patient appropriate for the [facility].  Previsit completed and red dot placed by patient's name on their procedure day (on provider's schedule).

## 2023-11-26 ENCOUNTER — Encounter: Payer: Self-pay | Admitting: Internal Medicine

## 2023-12-08 ENCOUNTER — Encounter: Payer: Self-pay | Admitting: Internal Medicine

## 2023-12-08 ENCOUNTER — Ambulatory Visit: Admitting: Internal Medicine

## 2023-12-08 VITALS — BP 126/74 | HR 56 | Temp 97.6°F | Resp 11 | Ht 70.0 in | Wt 212.0 lb

## 2023-12-08 DIAGNOSIS — Z1211 Encounter for screening for malignant neoplasm of colon: Secondary | ICD-10-CM | POA: Diagnosis present

## 2023-12-08 MED ORDER — SODIUM CHLORIDE 0.9 % IV SOLN: 500.0000 mL | Freq: Once | INTRAVENOUS | Status: DC

## 2023-12-08 NOTE — Op Note (Signed)
 [facility] Patient Name: Bradley [name] Procedure Date: 12/08/2023 9:37 AM MRN: [id number] Endoscopist: Norleen SAILOR. Abran , MD, [id number] Age: 63 Referring MD:  Date of Birth: 02-23-1960 Gender: Male Account #: [id number] Procedure:                Colonoscopy Indications:              Screening for colorectal malignant neoplasm.                            Previous exam 2008 was normal Medicines:                Monitored Anesthesia Care Procedure:                Pre-Anesthesia Assessment:                           - Prior to the procedure, a History and Physical                            was performed, and patient medications and                            allergies were reviewed. The patient's tolerance of                            previous anesthesia was also reviewed. The risks                            and benefits of the procedure and the sedation                            options and risks were discussed with the patient.                            All questions were answered, and informed consent                            was obtained. Prior Anticoagulants: The patient has                            taken no anticoagulant or antiplatelet agents. ASA                            Grade Assessment: II - A patient with mild systemic                            disease. After reviewing the risks and benefits,                            the patient was deemed in satisfactory condition to                            undergo the procedure.  After obtaining informed consent, the colonoscope                            was passed under direct vision. Throughout the                            procedure, the patient's blood pressure, pulse, and                            oxygen saturations were monitored continuously. The                            Olympus Scope SN [id number] was introduced through the                            anus and advanced to the the  cecum, identified by                            appendiceal orifice and ileocecal valve. The                            ileocecal valve, appendiceal orifice, and rectum                            were photographed. The quality of the bowel                            preparation was excellent. The colonoscopy was                            performed without difficulty. The patient tolerated                            the procedure well. The bowel preparation used was                            SUPREP. Scope In: 9:54:52 AM Scope Out: 10:04:51 AM Scope Withdrawal Time: 0 hours 7 minutes 45 seconds  Total Procedure Duration: 0 hours 9 minutes 59 seconds  Findings:                 The entire examined colon appeared normal on direct                            and retroflexion views. Complications:            No immediate complications. Estimated blood loss:                            None. Estimated Blood Loss:     Estimated blood loss: none. Impression:               - The entire examined colon is normal on direct and  retroflexion views.                           - No specimens collected. Recommendation:           - Repeat colonoscopy in 10 years for screening                            purposes.                           - Patient has a contact number available for                            emergencies. The signs and symptoms of potential                            delayed complications were discussed with the                            patient. Return to normal activities tomorrow.                            Written discharge instructions were provided to the                            patient.                           - Resume previous diet.                           - Continue present medications. Norleen SAILOR. Abran, MD 12/08/2023 10:08:33 AM This report has been signed electronically.

## 2023-12-08 NOTE — Patient Instructions (Addendum)
 Repeat colonoscopy in 10 years for screening purposes.  Continue your normal medications   YOU HAD AN ENDOSCOPIC PROCEDURE TODAY AT THE [facility]:   Refer to the procedure report that was given to you for any specific questions about what was found during the examination.  If the procedure report does not answer your questions, please call your gastroenterologist to clarify.  If you requested that your care partner not be given the details of your procedure findings, then the procedure report has been included in a sealed envelope for you to review at your convenience later.  YOU SHOULD EXPECT: Some feelings of bloating in the abdomen. Passage of more gas than usual.  Walking can help get rid of the air that was put into your GI tract during the procedure and reduce the bloating. If you had a lower endoscopy (such as a colonoscopy or flexible sigmoidoscopy) you may notice spotting of blood in your stool or on the toilet paper. If you underwent a bowel prep for your procedure, you may not have a normal bowel movement for a few days.  Please Note:  You might notice some irritation and congestion in your nose or some drainage.  This is from the oxygen used during your procedure.  There is no need for concern and it should clear up in a day or so.  SYMPTOMS TO REPORT IMMEDIATELY:  Following lower endoscopy (colonoscopy or flexible sigmoidoscopy):  Excessive amounts of blood in the stool  Significant tenderness or worsening of abdominal pains  Swelling of the abdomen that is new, acute  Fever of 100F or higher  For urgent or emergent issues, a gastroenterologist can be reached at any hour by calling [phone]. Do not use MyChart messaging for urgent concerns.    DIET:  We do recommend a small meal at first, but then you may proceed to your regular diet.  Drink plenty of fluids but you should avoid alcoholic beverages for 24 hours.  ACTIVITY:  You should plan to take it easy  for the rest of today and you should NOT DRIVE or use heavy machinery until tomorrow (because of the sedation medicines used during the test).    FOLLOW UP: Our staff will call the number listed on your records the next business day following your procedure.  We will call around 7:15- 8:00 am to check on you and address any questions or concerns that you may have regarding the information given to you following your procedure. If we do not reach you, we will leave a message.      SIGNATURES/CONFIDENTIALITY: You and/or your care partner have signed paperwork which will be entered into your electronic medical record.  These signatures attest to the fact that that the information above on your After Visit Summary has been reviewed and is understood.  Full responsibility of the confidentiality of this discharge information lies with you and/or your care-partner.

## 2023-12-08 NOTE — Progress Notes (Signed)
 HISTORY OF PRESENT ILLNESS:  Bradley [name] is a 63 y.o. male sent directly for screening colonoscopy.  No complaints.  Previous exam 2008 was normal.  He does report some left lower quadrant pain  REVIEW OF SYSTEMS:  All non-GI ROS negative except for  Past Medical History:  Diagnosis Date   Anxiety    C. difficile colitis    Diabetes mellitus without mention of complication    Hyperlipidemia    Hypertension    IBS (irritable bowel syndrome)    Internal hemorrhoids without mention of complication 01/02/2006   Rotator cuff tear     Past Surgical History:  Procedure Laterality Date   KNEE SURGERY Right    NM MYOCAR PERF WALL MOTION  10/2009   bruce myoview; defect consistent with diaphragmatic attenuation; EF 54%, normal study   TONSILLECTOMY     TUMOR REMOVAL  2002   from neck    Social History Bradley [name]  reports that he has never smoked. His smokeless tobacco use includes chew. He reports current alcohol  use. He reports that he does not use drugs.  family history includes Diabetes in his father; Heart disease in his mother; Heart disease (age of onset: 77) in his brother.  No Known Allergies     PHYSICAL EXAMINATION: Vital signs: BP (!) 143/81   Pulse 61   Temp 97.6 F (36.4 C) (Temporal)   Resp 13   Ht 5' 10 (1.778 m)   Wt 212 lb (96.2 kg)   SpO2 98%   BMI 30.42 kg/m  General: Well-developed, well-nourished, no acute distress HEENT: Sclerae are anicteric, conjunctiva pink. Oral mucosa intact Lungs: Clear Heart: Regular Abdomen: soft, nontender, nondistended, no obvious ascites, no peritoneal signs, normal bowel sounds. No organomegaly. Extremities: No edema Psychiatric: alert and oriented x3. Cooperative     ASSESSMENT:  Colon cancer screening   PLAN:  Screening colonoscopy

## 2023-12-08 NOTE — Progress Notes (Signed)
 Report to PACU, RN, vss, BBS= Clear.

## 2023-12-09 ENCOUNTER — Telehealth: Payer: Self-pay

## 2023-12-09 NOTE — Telephone Encounter (Signed)
 Left message on follow up call.

## 2023-12-23 ENCOUNTER — Other Ambulatory Visit: Payer: Self-pay | Admitting: Nurse Practitioner

## 2023-12-23 DIAGNOSIS — R1030 Lower abdominal pain, unspecified: Secondary | ICD-10-CM

## 2023-12-25 ENCOUNTER — Other Ambulatory Visit

## 2023-12-25 DIAGNOSIS — R1030 Lower abdominal pain, unspecified: Secondary | ICD-10-CM

## 2023-12-25 MED ORDER — IOPAMIDOL (ISOVUE-300) INJECTION 61%
100.0000 mL | Freq: Once | INTRAVENOUS | Status: AC | PRN
  Administered 2023-12-25: 100 mL via INTRAVENOUS

## 2023-12-28 ENCOUNTER — Other Ambulatory Visit

## 2024-01-25 NOTE — Progress Notes (Unsigned)
 "  Chief Complaint: Primary GI MD:  HPI:  *** is a  ***  who was referred to me by Yolande Toribio MATSU, MD for a complaint of *** .     Discussed the use of AI scribe software for clinical note transcription with the patient, who gave verbal consent to proceed.  History of Present Illness      PREVIOUS GI WORKUP   Colonoscopy 12/08/2023 for screening: Normal - Repeat 12/2033  Past Medical History:  Diagnosis Date   Anxiety    C. difficile colitis    Diabetes mellitus without mention of complication    Hyperlipidemia    Hypertension    IBS (irritable bowel syndrome)    Internal hemorrhoids without mention of complication 01/02/2006   Rotator cuff tear     Past Surgical History:  Procedure Laterality Date   KNEE SURGERY Right    NM MYOCAR PERF WALL MOTION  10/2009   bruce myoview; defect consistent with diaphragmatic attenuation; EF 54%, normal study   TONSILLECTOMY     TUMOR REMOVAL  2002   from neck    Current Outpatient Medications  Medication Sig Dispense Refill   ALPRAZolam (XANAX) 1 MG tablet Take by mouth 3 (three) times daily as needed for anxiety. (Patient not taking: No sig reported)     amLODipine (NORVASC) 5 MG tablet Take 5 mg by mouth daily.     atorvastatin (LIPITOR) 40 MG tablet Take 40 mg by mouth daily.       azithromycin (ZITHROMAX) 500 MG tablet as directed. (Patient not taking: No sig reported)     clonazePAM (KLONOPIN) 0.5 MG tablet Take 0.5 mg by mouth 2 (two) times daily as needed.     ezetimibe (ZETIA) 10 MG tablet Take 10 mg by mouth daily.     metoCLOPramide (REGLAN) 5 MG tablet Take 5 mg by mouth daily.  (Patient not taking: No sig reported)     metoprolol succinate (TOPROL-XL) 100 MG 24 hr tablet Take 100 mg by mouth daily.     metoprolol tartrate (LOPRESSOR) 25 MG tablet Take 25 mg by mouth 2 (two) times daily.     OZEMPIC, 0.25 OR 0.5 MG/DOSE, 2 MG/3ML SOPN SMARTSIG:0.2 Milligram(s) SUB-Q Once a Week     pioglitazone (ACTOS) 15 MG  tablet Take 15 mg by mouth daily.       sertraline  (ZOLOFT ) 100 MG tablet Take 100 mg by mouth daily.     Simethicone (GAS-X PO) Take by mouth as needed. (Patient not taking: No sig reported)     valsartan-hydrochlorothiazide (DIOVAN-HCT) 160-12.5 MG per tablet Take 1 tablet by mouth daily.     No current facility-administered medications for this visit.    Allergies as of 01/26/2024   (No Known Allergies)    Family History  Problem Relation Age of Onset   Heart disease Mother    Diabetes Father    Heart disease Brother 2   Colon cancer Neg Hx    Rectal cancer Neg Hx    Stomach cancer Neg Hx     Social History   Socioeconomic History   Marital status: Married    Spouse name: Not on file   Number of children: 2   Years of education: Not on file   Highest education level: Not on file  Occupational History   Occupation: Sales  Tobacco Use   Smoking status: Never   Smokeless tobacco: Current    Types: Chew   Tobacco comments:    Used  Sunday  Substance and Sexual Activity   Alcohol  use: Yes   Drug use: No   Sexual activity: Not on file  Other Topics Concern   Not on file  Social History Narrative   Not on file   Social Drivers of Health   Tobacco Use: High Risk (12/08/2023)   Patient History    Smoking Tobacco Use: Never    Smokeless Tobacco Use: Current    Passive Exposure: Not on file  Financial Resource Strain: Not on file  Food Insecurity: Not on file  Transportation Needs: Not on file  Physical Activity: Not on file  Stress: Not on file  Social Connections: Not on file  Intimate Partner Violence: Not At Risk (01/10/2023)   Received from [facility]   HITS    Over the last 12 months how often did your partner physically hurt you?: Never    Over the last 12 months how often did your partner insult you or talk down to you?: Never    Over the last 12 months how often did your partner threaten you with physical harm?: Never    Over the last 12 months how  often did your partner scream or curse at you?: Never  Depression (PHQ2-9): Not on file  Alcohol  Screen: Not on file  Housing: Not on file  Utilities: Not on file  Health Literacy: Not on file    Review of Systems:    Constitutional: No weight loss, fever, chills, weakness or fatigue HEENT: Eyes: No change in vision               Ears, Nose, Throat:  No change in hearing or congestion Skin: No rash or itching Cardiovascular: No chest pain, chest pressure or palpitations   Respiratory: No SOB or cough Gastrointestinal: See HPI and otherwise negative Genitourinary: No dysuria or change in urinary frequency Neurological: No headache, dizziness or syncope Musculoskeletal: No new muscle or joint pain Hematologic: No bleeding or bruising Psychiatric: No history of depression or anxiety    Physical Exam:  Vital signs: There were no vitals taken for this visit.  Constitutional: NAD, alert and cooperative Head:  Normocephalic and atraumatic. Eyes:   PEERL, EOMI. No icterus. Conjunctiva pink. Respiratory: Respirations even and unlabored. Lungs clear to auscultation bilaterally.   No wheezes, crackles, or rhonchi.  Cardiovascular:  Regular rate and rhythm. No peripheral edema, cyanosis or pallor.  Gastrointestinal:  Soft, nondistended, nontender. No rebound or guarding. Normal bowel sounds. No appreciable masses or hepatomegaly. Rectal:  Declines Msk:  Symmetrical without gross deformities. Without edema, no deformity or joint abnormality.  Neurologic:  Alert and  oriented x4;  grossly normal neurologically.  Skin:   Dry and intact without significant lesions or rashes. Psychiatric: Oriented to person, place and time. Demonstrates good judgement and reason without abnormal affect or behaviors.  Physical Exam    RELEVANT LABS AND IMAGING: CBC    Component Value Date/Time   WBC 7.2 03/29/2013 1245   RBC 4.96 03/29/2013 1245   HGB 16.3 06/28/2013 0221   HCT 48.0 06/28/2013 0221    PLT 269 03/29/2013 1245   MCV 85.3 03/29/2013 1245   MCH 30.0 03/29/2013 1245   MCHC 35.2 03/29/2013 1245   RDW 13.4 03/29/2013 1245   LYMPHSABS 1.5 05/04/2012 0800   MONOABS 0.6 05/04/2012 0800   EOSABS 0.0 05/04/2012 0800   BASOSABS 0.1 05/04/2012 0800    CMP     Component Value Date/Time   NA 140 06/28/2013 0221  K 3.6 (L) 06/28/2013 0221   CL 99 06/28/2013 0221   CO2 27 03/29/2013 1245   GLUCOSE 115 (H) 06/28/2013 0221   BUN 10 06/28/2013 0221   CREATININE 0.80 06/28/2013 0221   CALCIUM 10.5 03/29/2013 1245   PROT 7.8 05/04/2012 0800   ALBUMIN 4.5 05/04/2012 0800   AST 20 05/04/2012 0800   ALT 18 05/04/2012 0800   ALKPHOS 88 05/04/2012 0800   BILITOT 0.8 05/04/2012 0800   GFRNONAA >90 03/29/2013 1245   GFRAA >90 03/29/2013 1245     Assessment/Plan:   Assessment and Plan Assessment & Plan     Colon cancer screening Colonoscopy 12/2023: Normal 10-year recall - Repeat 12/2033   Nestor Blower, PA-C [facility] 01/25/2024, 9:30 PM  Cc: Yolande Toribio MATSU, MD "

## 2024-01-26 ENCOUNTER — Ambulatory Visit (INDEPENDENT_AMBULATORY_CARE_PROVIDER_SITE_OTHER): Admitting: Gastroenterology

## 2024-01-26 ENCOUNTER — Encounter: Payer: Self-pay | Admitting: Gastroenterology

## 2024-01-26 VITALS — BP 116/74 | HR 64 | Ht 69.0 in | Wt 206.1 lb

## 2024-01-26 DIAGNOSIS — R1030 Lower abdominal pain, unspecified: Secondary | ICD-10-CM

## 2024-01-26 DIAGNOSIS — Z1211 Encounter for screening for malignant neoplasm of colon: Secondary | ICD-10-CM

## 2024-01-26 DIAGNOSIS — K5900 Constipation, unspecified: Secondary | ICD-10-CM

## 2024-01-26 DIAGNOSIS — K581 Irritable bowel syndrome with constipation: Secondary | ICD-10-CM

## 2024-01-26 NOTE — Progress Notes (Signed)
 Noted.

## 2024-01-26 NOTE — Patient Instructions (Addendum)
 You have been scheduled for an appointment with [facility] PA-C on 03-08-24 at 920am . Please arrive 10 minutes early for your appointment.  A high fiber diet with plenty of fluids (up to 8 glasses of water daily) is suggested to relieve these symptoms.  Benefiber, 1 tablespoon once or twice daily can be used to keep bowels regular if needed.   Start taking Miralax 1 capful (17 grams) 1x / day for 1 week.   If this is not effective, increase to 1 dose 2x / day for 1 week.   If this is still not effective, increase to two capfuls (34 grams) 2x / day.   Can adjust dose as needed based on response. Can take 1/2 cap daily, skip days, or increase per day.    VISIT SUMMARY:  During your visit, we discussed your lower abdominal pain and constipation that have persisted for the past few months. We reviewed your recent colonoscopy and CT scan results, which were normal, and discussed your current symptoms and lifestyle factors.  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 It is likely worsened by factors such as your medication (semaglutide), decreased food intake, and use of nicotine pouches. To help manage this, you should start using Benefiber powder 1-2 times daily and can use polyethylene glycol (Miralax) half to one capful as needed if the fiber is not enough. I also provided you with IB Guard samples to help relieve your abdominal pain. Additionally, using a squatty potty can help improve your posture during bowel movements. It's important to maintain regular food intake to support your digestive health. Drink 64oz of water  INSTRUCTIONS:  Please follow up in six weeks, but you can cancel the appointment if your symptoms resolve.

## 2024-03-08 ENCOUNTER — Ambulatory Visit: Admitting: Gastroenterology
